# Patient Record
Sex: Female | Born: 1970 | Race: Black or African American | Hispanic: No | Marital: Married | Smoking: Never smoker
Health system: Southern US, Community
[De-identification: ages and names within clinical notes are randomized; demographics above are authoritative.]

## PROBLEM LIST (undated history)

## (undated) DIAGNOSIS — I10 Essential (primary) hypertension: Secondary | ICD-10-CM

## (undated) DIAGNOSIS — K900 Celiac disease: Secondary | ICD-10-CM

## (undated) DIAGNOSIS — R011 Cardiac murmur, unspecified: Secondary | ICD-10-CM

## (undated) DIAGNOSIS — G459 Transient cerebral ischemic attack, unspecified: Secondary | ICD-10-CM

## (undated) HISTORY — DX: Transient cerebral ischemic attack, unspecified: G45.9

## (undated) HISTORY — DX: Celiac disease: K90.0

## (undated) HISTORY — DX: Cardiac murmur, unspecified: R01.1

## (undated) HISTORY — PX: OTHER SURGICAL HISTORY: SHX169

---

## 2015-08-05 ENCOUNTER — Other Ambulatory Visit: Payer: Self-pay | Admitting: Family Medicine

## 2015-08-05 DIAGNOSIS — Z1231 Encounter for screening mammogram for malignant neoplasm of breast: Secondary | ICD-10-CM

## 2015-08-27 ENCOUNTER — Ambulatory Visit: Payer: Self-pay

## 2018-04-21 ENCOUNTER — Encounter: Payer: Self-pay | Admitting: Internal Medicine

## 2018-04-21 ENCOUNTER — Other Ambulatory Visit: Payer: Self-pay

## 2018-04-21 ENCOUNTER — Observation Stay
Admission: EM | Admit: 2018-04-21 | Discharge: 2018-04-22 | Disposition: A | Discharge status: Home / Self Care | Payer: 59 | Attending: Internal Medicine | Admitting: Internal Medicine

## 2018-04-21 ENCOUNTER — Observation Stay: Payer: 59

## 2018-04-21 ENCOUNTER — Emergency Department: Payer: 59

## 2018-04-21 DIAGNOSIS — Z8249 Family history of ischemic heart disease and other diseases of the circulatory system: Secondary | ICD-10-CM | POA: Insufficient documentation

## 2018-04-21 DIAGNOSIS — I10 Essential (primary) hypertension: Secondary | ICD-10-CM | POA: Diagnosis not present

## 2018-04-21 DIAGNOSIS — R29701 NIHSS score 1: Secondary | ICD-10-CM | POA: Diagnosis not present

## 2018-04-21 DIAGNOSIS — Z886 Allergy status to analgesic agent status: Secondary | ICD-10-CM | POA: Insufficient documentation

## 2018-04-21 DIAGNOSIS — F419 Anxiety disorder, unspecified: Secondary | ICD-10-CM | POA: Diagnosis not present

## 2018-04-21 DIAGNOSIS — Z79899 Other long term (current) drug therapy: Secondary | ICD-10-CM | POA: Diagnosis not present

## 2018-04-21 DIAGNOSIS — G459 Transient cerebral ischemic attack, unspecified: Secondary | ICD-10-CM | POA: Diagnosis present

## 2018-04-21 DIAGNOSIS — R262 Difficulty in walking, not elsewhere classified: Secondary | ICD-10-CM | POA: Insufficient documentation

## 2018-04-21 HISTORY — DX: Essential (primary) hypertension: I10

## 2018-04-21 LAB — APTT: APTT: 25 s (ref 24–36)

## 2018-04-21 LAB — DIFFERENTIAL
BASOS PCT: 1 %
Basophils Absolute: 0.1 10*3/uL (ref 0–0.1)
EOS ABS: 0.1 10*3/uL (ref 0–0.7)
Eosinophils Relative: 2 %
Lymphocytes Relative: 30 %
Lymphs Abs: 1.7 10*3/uL (ref 1.0–3.6)
MONOS PCT: 12 %
Monocytes Absolute: 0.7 10*3/uL (ref 0.2–0.9)
NEUTROS ABS: 3.1 10*3/uL (ref 1.4–6.5)
NEUTROS PCT: 55 %

## 2018-04-21 LAB — CBC
HEMATOCRIT: 34.1 % — AB (ref 35.0–47.0)
Hemoglobin: 11.1 g/dL — ABNORMAL LOW (ref 12.0–16.0)
MCH: 23.3 pg — ABNORMAL LOW (ref 26.0–34.0)
MCHC: 32.4 g/dL (ref 32.0–36.0)
MCV: 72 fL — ABNORMAL LOW (ref 80.0–100.0)
Platelets: 352 10*3/uL (ref 150–440)
RBC: 4.74 MIL/uL (ref 3.80–5.20)
RDW: 18.5 % — AB (ref 11.5–14.5)
WBC: 5.6 10*3/uL (ref 3.6–11.0)

## 2018-04-21 LAB — COMPREHENSIVE METABOLIC PANEL
ALT: 21 U/L (ref 0–44)
AST: 32 U/L (ref 15–41)
Albumin: 3.7 g/dL (ref 3.5–5.0)
Alkaline Phosphatase: 34 U/L — ABNORMAL LOW (ref 38–126)
Anion gap: 7 (ref 5–15)
BUN: 9 mg/dL (ref 6–20)
CHLORIDE: 111 mmol/L (ref 98–111)
CO2: 21 mmol/L — AB (ref 22–32)
CREATININE: 0.85 mg/dL (ref 0.44–1.00)
Calcium: 8.2 mg/dL — ABNORMAL LOW (ref 8.9–10.3)
GFR calc Af Amer: >60 mL/min (ref >60)
GFR calc non Af Amer: >60 mL/min (ref >60)
Glucose, Bld: 96 mg/dL (ref 70–99)
POTASSIUM: 4.2 mmol/L (ref 3.5–5.1)
Sodium: 139 mmol/L (ref 135–145)
Total Bilirubin: 0.7 mg/dL (ref 0.3–1.2)
Total Protein: 7 g/dL (ref 6.5–8.1)

## 2018-04-21 LAB — PROTIME-INR
INR: 0.96
Prothrombin Time: 12.7 seconds (ref 11.4–15.2)

## 2018-04-21 LAB — TROPONIN I

## 2018-04-21 LAB — GLUCOSE, CAPILLARY: Glucose-Capillary: 93 mg/dL (ref 70–99)

## 2018-04-21 MED ORDER — CLONAZEPAM 0.5 MG PO TABS
0.5000 mg | ORAL_TABLET | Freq: Two times a day (BID) | ORAL | Status: DC | PRN
  Filled 2018-04-21: qty 1

## 2018-04-21 MED ORDER — ATORVASTATIN CALCIUM 20 MG PO TABS
40.0000 mg | ORAL_TABLET | Freq: Every day | ORAL | Status: DC
  Administered 2018-04-21: 40 mg via ORAL
  Filled 2018-04-21: qty 2

## 2018-04-21 MED ORDER — ACETAMINOPHEN 650 MG RE SUPP: 650.0000 mg | RECTAL | Status: DC | PRN

## 2018-04-21 MED ORDER — LOSARTAN POTASSIUM 50 MG PO TABS
50.0000 mg | ORAL_TABLET | Freq: Every day | ORAL | Status: DC
  Administered 2018-04-22: 50 mg via ORAL
  Filled 2018-04-21: qty 1

## 2018-04-21 MED ORDER — HYDRALAZINE HCL 20 MG/ML IJ SOLN: 10.0000 mg | Freq: Four times a day (QID) | INTRAMUSCULAR | Status: DC | PRN

## 2018-04-21 MED ORDER — ACETAMINOPHEN 325 MG PO TABS
650.0000 mg | ORAL_TABLET | ORAL | Status: DC | PRN
  Administered 2018-04-21: 18:00:00 650 mg via ORAL
  Filled 2018-04-21: qty 2

## 2018-04-21 MED ORDER — STROKE: EARLY STAGES OF RECOVERY BOOK
Freq: Once | Status: AC
  Administered 2018-04-21: 14:00:00

## 2018-04-21 MED ORDER — ACETAMINOPHEN 160 MG/5ML PO SOLN
650.0000 mg | ORAL | Status: DC | PRN
  Filled 2018-04-21: qty 20.3

## 2018-04-21 MED ORDER — SODIUM CHLORIDE 0.9 % IV SOLN
INTRAVENOUS | Status: DC
  Administered 2018-04-21 – 2018-04-22 (×2): via INTRAVENOUS

## 2018-04-21 MED ORDER — CLOPIDOGREL BISULFATE 75 MG PO TABS
75.0000 mg | ORAL_TABLET | Freq: Every day | ORAL | Status: DC
  Administered 2018-04-22: 75 mg via ORAL
  Filled 2018-04-21: qty 1

## 2018-04-21 MED ORDER — CLOPIDOGREL BISULFATE 75 MG PO TABS
300.0000 mg | ORAL_TABLET | Freq: Once | ORAL | Status: AC
  Administered 2018-04-21: 300 mg via ORAL
  Filled 2018-04-21: qty 4

## 2018-04-21 MED ORDER — METOPROLOL TARTRATE 25 MG PO TABS
25.0000 mg | ORAL_TABLET | Freq: Two times a day (BID) | ORAL | Status: DC
  Administered 2018-04-21 – 2018-04-22 (×3): 25 mg via ORAL
  Filled 2018-04-21 (×3): qty 1

## 2018-04-21 MED ORDER — SENNOSIDES-DOCUSATE SODIUM 8.6-50 MG PO TABS: 1.0000 | ORAL_TABLET | Freq: Every evening | ORAL | Status: DC | PRN

## 2018-04-21 MED ORDER — ENOXAPARIN SODIUM 40 MG/0.4ML ~~LOC~~ SOLN
40.0000 mg | SUBCUTANEOUS | Status: DC
  Administered 2018-04-21: 40 mg via SUBCUTANEOUS
  Filled 2018-04-21: qty 0.4

## 2018-04-21 NOTE — ED Triage Notes (Signed)
First nurse:  Patient says at 630 am her left arm was completely numb and she could not move her hand at all.  Says her left side face was numb as well and she was having trouble speaking.  Continues to have numbness in face and arm.  Has good strength in both arms.  Can see some slight droop on left side face.  Speech is clear now.

## 2018-04-21 NOTE — Evaluation (Signed)
Physical Therapy Evaluation Patient Details Name: Maria Rhodes MRN: [id number] DOB: 04/13/1971 Today's Date: 04/21/2018   History of Present Illness  Pt is a 47 y.o. female that was addmitted to hosptial 8/16 for dx of TIA. Pts c/c are numbness, L UE weakness, and difficulty speaking. PMH includes HTN.  Clinical Impression  Pt is a pleasant 47 year old female who was admitted for TIA. Pt performs bed mobility, transfers, and ambulation with Mod I at most for safety. PT noted no difficulties with UEs/LEs in strength, coordination, sensation, 2 point discrimination, and RAMs. Pt noted no difficulties with cognition and speaking. Pt did state that her L UE still feel weak and diminished sensation, but Pt noticed no significant difference R vs L. PT does not recommend any follow up PT 2/2 pt at roughly baseline level, but educated pt on signs/symptoms of stroke and pt verbalized understanding. This entire session was guided, instructed, and directly supervised by Cephus SlaterKristen Brown , DPT.   Follow Up Recommendations No PT follow up    Equipment Recommendations  None recommended by PT    Recommendations for Other Services       Precautions / Restrictions Precautions Precautions: Fall Restrictions Weight Bearing Restrictions: No      Mobility  Bed Mobility Overal bed mobility: Modified Independent Bed Mobility: Supine to Sit     Supine to sit: HOB elevated     General bed mobility comments: NO difficulties noted  Transfers Overall transfer level: Needs assistance Equipment used: None Transfers: Sit to/from Stand Sit to Stand: Modified independent (Device/Increase time)         General transfer comment: for safety  Ambulation/Gait Ambulation/Gait assistance: Independent Gait Distance (Feet): 20 Feet Assistive device: None Gait Pattern/deviations: Step-to pattern     General Gait Details: Pt walked 20' in room and had no difficulties turning, head turns, and pt  performs squat to pick up jaket with no difficulty.  Stairs            Wheelchair Mobility    Modified Rankin (Stroke Patients Only)       Balance Overall balance assessment: Independent Sitting-balance support: No upper extremity supported;Feet unsupported Sitting balance-Leahy Scale: Normal     Standing balance support: No upper extremity supported Standing balance-Leahy Scale: Good   Single Leg Stance - Right Leg: 10 Single Leg Stance - Left Leg: 10     Rhomberg - Eyes Opened: 30                   Pertinent Vitals/Pain Pain Assessment: No/denies pain    Home Living Family/patient expects to be discharged to:: Private residence Living Arrangements: Alone Available Help at Discharge: Family;Friend(s)(prefers no help) Type of Home: Mobile home Home Access: Level entry     Home Layout: One level Home Equipment: None      Prior Function Level of Independence: Independent               Hand Dominance   Dominant Hand: Right    Extremity/Trunk Assessment   Upper Extremity Assessment Upper Extremity Assessment: Overall WFL for tasks assessed    Lower Extremity Assessment Lower Extremity Assessment: Overall WFL for tasks assessed    Cervical / Trunk Assessment Cervical / Trunk Assessment: Normal  Communication   Communication: No difficulties  Cognition Arousal/Alertness: Awake/alert Behavior During Therapy: WFL for tasks assessed/performed Overall Cognitive Status: Within Functional Limits for tasks assessed  General Comments      Exercises     Assessment/Plan    PT Assessment Patent does not need any further PT services  PT Problem List         PT Treatment Interventions      PT Goals (Current goals can be found in the Care Plan section)  Acute Rehab PT Goals Patient Stated Goal: go home PT Goal Formulation: With patient Time For Goal Achievement:  05/05/18 Potential to Achieve Goals: Good    Frequency     Barriers to discharge        Co-evaluation               AM-PAC PT "6 Clicks" Daily Activity  Outcome Measure Difficulty turning over in bed (including adjusting bedclothes, sheets and blankets)?: None Difficulty moving from lying on back to sitting on the side of the bed? : None Difficulty sitting down on and standing up from a chair with arms (e.g., wheelchair, bedside commode, etc,.)?: None Help needed moving to and from a bed to chair (including a wheelchair)?: None Help needed walking in hospital room?: None Help needed climbing 3-5 steps with a railing? : None 6 Click Score: 24    End of Session Equipment Utilized During Treatment: Gait belt Activity Tolerance: Patient tolerated treatment well Patient left: in bed;with call bell/phone within reach;with nursing/sitter in room Nurse Communication: Mobility status PT Visit Diagnosis: Difficulty in walking, not elsewhere classified (R26.2)    Time: [id number] PT Time Calculation (min) (ACUTE ONLY): 21 min   Charges:              Maria Rhodes, SPT   Maria Rhodes 04/21/2018, 5:08 PM

## 2018-04-21 NOTE — Progress Notes (Signed)
OT Cancellation Note  Patient Details Name: Maria Rhodes MRN: [id number] DOB: 01/10/1971   Cancelled Treatment:    Reason Eval/Treat Not Completed: Patient at procedure or test/ unavailable. On 2nd attempt, pt out of the room for testing. Daughter in room, reports pt is doing very well, ambulating without difficulty and no functional deficits related to slight numbness/tingling that remains in her L hand. Will re-attempt OT evaluation at later date/time as pt is available and medically appropriate.   Richrd PrimeJamie Stiller, MPH, MS, OTR/L ascom [phone] 04/21/18, 3:35 PM

## 2018-04-21 NOTE — Progress Notes (Signed)
SLP Cancellation Note  Patient Details Name: Maria Rhodes MRN: [id number] DOB: 11/21/1970   Cancelled treatment:       Reason Eval/Treat Not Completed: SLP screened, no needs identified, will sign off(chart reviewed; consulted NSG re: pt's status). Per NSG and family report, pt is exhibiting no functional deficits except slight numbness/tingling that remains in her L hand. Pt has denied any difficulty swallowing and is currently on a regular diet; tolerates swallowing pills w/ water per NSG. Pt converses at conversational level w/out deficits noted by NSG and family; no speech-language deficits.  No further skilled ST services indicated as pt appears back at her baseline per report. NSG to reconsult if any change in status.     Jerilynn SomKatherine Miliyah Luper, MS, CCC-SLP Purvi Ruehl 04/21/2018, 3:50 PM

## 2018-04-21 NOTE — Consult Note (Signed)
[facility] TeleNeurology Consult Services   The patient was informed the neurology consult would happen via TeleHealth by way of interactive audio and visual telecommunications and consented to receiving care in this manner for acute stroke protocol. DATE: April 21, 2018 Impression: Stroke-the patient appears to be having a right hemispheric subcortical stroke syndrome-her left arm weakness has resolved and I'll she has left-sided numbness.  At this point with minor nondisabling symptoms that are improving would not recommend acute thrombolytic therapy.  If her symptoms decline in any way or she has return of loss of motor function and still fits within the TPA window would still consider the medication.  For now risk outweighs benefit particularly in light of being right-handed.  Did discuss TPA with her however.  Recommend starting Plavix as she has a true aspirin allergy and would admitted for stroke workup.  She'll need inpatient neurology consultation.  Allow permissive hypertension per not TPA stroke protocol until neurologic evaluation.   Not a tpa candidate due to: Minor nondisabling symptoms  Symptoms (not) consistent with LVO therefore no role for neuro-intervention  Differential Diagnosis: 1. Cardioembolic stroke 2. Small vessel disease/lacune 3. Thromboembolic, artery-to-artery mechanism 4. Hypercoagulable state-related infarct 5. Transient ischemic attack 6. Thrombotic mechanism, large artery disease  Comments: Last known normal 06:30 Door time 08:05 [facility] contacted: 08:24 [facility] at bedside: 08:30 NIHSS assessment time: 08:32  Recommendations: -Start Plavix 300 mg load 75 mg daily -Admitted for stroke workup and inpatient neurology consultation -Permissive hypertension per non-TPA stroke protocol  Inpatient neurology consultation Inpatient stroke evaluation as per Neurology/ Internal Medicine Discussed with ED MD Please call with  questions ----------------------------------------------------------------------------------------- CC left-sided weakness numbness  History of Present Illness  Patient is a 47 year old woman with a history of hypertension and anxiety who woke up this morning at 6 AM without any issues.  She then developed at 6:30 in the morning left-sided numbness to her face and arm in addition to some left hand and arm weakness she still has been numbness but her weakness has completely resolved.  Her blood pressure is moderately elevated in the 140 systolic range.  She has an aspirin allergy takes no blood thinners.  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  Exam: 1a- LOC: Keenly responsive - =0     1b- LOC questions: Answers both questions correctly - 0     1c- LOC commands- Performs both tasks correctly- 0     2- Gaze: Normal; no gaze paresis or gaze deviation - 0     3- Visual Fields: normal, no Visual field deficit - 0     4- Facial movements: no facial palsy - 0     5- Upper limb motor - no drift -0    no drift and manual muscle strength testing of the grips biceps and triceps are symmetric 6- Lower limb motor - no drift - 0      7- Limb Coordination: absent ataxia - 0      8- Sensory : Decreased left face and arm=1    9- Language - No aphasia - 0      10- Speech - No dysarthria -0     11- Neglect / Extinction - none found -0     NIHSS score  1   Medical Decision Making: - Extensive number of diagnosis or management options are considered above. - Extensive amount of complex data reviewed. - High risk of complication and/or morbidity or mortality are associated with differential  diagnostic considerations above. - There may be Uncertain outcome and increased probability of prolonged functional impairment or high probability of severe prolonged functional impairment associated with some of these differential diagnosis. Medical Data Reviewed: 1.Data reviewed  include clinical labs, radiology, Medical Tests; 2.Tests results discussed w/performing or interpreting physician; 3.Obtaining/reviewing old medical records; 4.Obtaining case history from another source; 5.Independent review of image, tracing or specimen. Patient was informed the Neurology Consult would happen via telehealth (remote video) and consented to receiving care in this manner.

## 2018-04-21 NOTE — Progress Notes (Signed)
Chaplain was informed of code stroke by another chaplain. Chaplain went to patient's room and introduced herself to patient and family member. Chaplain ask patient how was she feeling, patient said she was ready to go. Patient seemed to be shock as why the Chaplain was here to visit her. Chaplain explain to patient she was a part of the care team and it was normal procedure. Chaplain ask was there anything she could do for her and patient said no thank you. Chaplain said if she needs her have nurse page.

## 2018-04-21 NOTE — H&P (Signed)
[facility] - [place] [facility]   PATIENT NAME: Maria Rhodes    MR#:  [id number]  DATE OF BIRTH:  12/17/1970  DATE OF ADMISSION:  04/21/2018  PRIMARY CARE PHYSICIAN: Maria LouShapely-Quinn, Todd [place], MD   REQUESTING/REFERRING PHYSICIAN: Dionne BucySiadecki, Sebastian, MD  CHIEF COMPLAINT:   Chief Complaint  Patient presents with  . Numbness  . Code Stroke   Left arm weakness, numbness and tingling this morning. HISTORY OF PRESENT ILLNESS:  Maria Rhodes  is a 47 y.o. female with a known history of hypertension.  The patient presents the ED with above chief complaints.  She started to have left arm weakness, numbness and tingling at about 6:30 this morning, now almost resolved but still has some numbness.  She also has some numbness on the left side of the face and difficulty speaking, which resolved.  The patient denies any dysphagia, slurred speech or incontinence.  She denies any headache or dizziness.  CT of the head is negative. PAST MEDICAL HISTORY:   Past Medical History:  Diagnosis Date  . HTN (hypertension)     PAST SURGICAL HISTORY:  No surgical history.  SOCIAL HISTORY:   Social History   Tobacco Use  . Smoking status: Not on file  Substance Use Topics  . Alcohol use: Not on file    FAMILY HISTORY:   Family History  Problem Relation Age of Onset  . Hypertension Mother   . Hypertension Father     DRUG ALLERGIES:   Allergies  Allergen Reactions  . Asa [Aspirin] Nausea Only and Rash    REVIEW OF SYSTEMS:   Review of Systems  Constitutional: Negative for chills, fever and malaise/fatigue.  HENT: Negative for sore throat.   Eyes: Negative for blurred vision and double vision.  Respiratory: Negative for cough, hemoptysis, shortness of breath, wheezing and stridor.   Cardiovascular: Negative for chest pain, palpitations, orthopnea and leg swelling.  Gastrointestinal: Negative for abdominal pain, blood in stool, diarrhea,  melena, nausea and vomiting.  Genitourinary: Negative for dysuria, flank pain and hematuria.  Musculoskeletal: Negative for back pain and joint pain.  Skin: Negative for rash.  Neurological: Positive for tingling, sensory change, speech change and focal weakness. Negative for dizziness, tremors, seizures, loss of consciousness, weakness and headaches.  Endo/Heme/Allergies: Negative for polydipsia.  Psychiatric/Behavioral: Negative for depression. The patient is not nervous/anxious.     MEDICATIONS AT HOME:   Prior to Admission medications   Medication Sig Start Date End Date Taking? Authorizing Provider  ALAYCEN 1/35 tablet Take 1 tablet by mouth daily. 04/17/18  Yes [provider]  clonazePAM (KLONOPIN) 0.5 MG tablet Take 0.5 mg by mouth 2 (two) times daily as needed for anxiety. 02/06/18  Yes [provider]  losartan (COZAAR) 100 MG tablet Take 50 mg by mouth daily. 04/01/18  Yes [provider]      VITAL SIGNS:  Blood pressure (!) 179/104, pulse 78, resp. rate (!) 27, weight 67 kg, SpO2 100 %.  PHYSICAL EXAMINATION:  Physical Exam  GENERAL:  [age] y.o.-year-old patient lying in the bed with no acute distress.  EYES: Pupils equal, round, reactive to light and accommodation. No scleral icterus. Extraocular muscles intact.  HEENT: Head atraumatic, normocephalic. Oropharynx and nasopharynx clear.  NECK:  Supple, no jugular venous distention. No thyroid enlargement, no tenderness.  LUNGS: Normal breath sounds bilaterally, no wheezing, rales,rhonchi or crepitation. No use of accessory muscles of respiration.  CARDIOVASCULAR: S1, S2 normal. No murmurs, rubs, or gallops.  ABDOMEN: Soft, nontender, nondistended. Bowel sounds present. No organomegaly or mass.  EXTREMITIES: No pedal edema, cyanosis, or clubbing.  NEUROLOGIC: Cranial nerves II through XII are intact. Muscle strength 5/5 in all extremities. Sensation intact. Gait not checked.  PSYCHIATRIC: The patient  is alert and oriented x 3.  SKIN: No obvious rash, lesion, or ulcer.   LABORATORY PANEL:   CBC Recent Labs  Lab 04/21/18 0827  WBC 5.6  HGB 11.1*  HCT 34.1*  PLT 352   ------------------------------------------------------------------------------------------------------------------  Chemistries  Recent Labs  Lab 04/21/18 0827  NA 139  K 4.2  CL 111  CO2 21*  GLUCOSE 96  BUN 9  CREATININE 0.85  CALCIUM 8.2*  AST 32  ALT 21  ALKPHOS 34*  BILITOT 0.7   ------------------------------------------------------------------------------------------------------------------  Cardiac Enzymes Recent Labs  Lab 04/21/18 0827  TROPONINI <0.03   ------------------------------------------------------------------------------------------------------------------  RADIOLOGY:  Ct Head Code Stroke Wo Contrast  Result Date: 04/21/2018 CLINICAL DATA:  Code stroke.  Lost feeling in left arm. EXAM: CT HEAD WITHOUT CONTRAST TECHNIQUE: Contiguous axial images were obtained from the base of the skull through the vertex without intravenous contrast. COMPARISON:  None. FINDINGS: Brain: Small remote right inferior cerebellar infarct. No evidence of acute infarct. No hemorrhage, hydrocephalus, or masslike finding Vascular: No hyperdense vessel Skull: Negative Sinuses/Orbits: Negative Other: These results were called by telephone at the time of interpretation on 04/21/2018 at 8:30 am to Dr. Dionne BucySEBASTIAN SIADECKI , who verbally acknowledged these results. ASPECTS [facility]([place] Stroke Program Early CT Score) - Ganglionic level infarction (caudate, lentiform nuclei, internal capsule, insula, M1-M3 cortex): 7 - Supraganglionic infarction (M4-M6 cortex): 3 Total score (0-10 with 10 being normal): 10 IMPRESSION: 1. No acute finding.  ASPECTS is 10. 2. Small remote right cerebellar infarct. Electronically Signed   By: Marnee SpringJonathon  Watts M.D.   On: 04/21/2018 08:31      IMPRESSION AND PLAN:   TIA, rule out acute  CVA. Patient will be placed for observation. She is treated with Plavix 300 mg in the ED, continue Plavix 75 mg daily, Lipitor 40 mg at bedtime, check lipid panel.  Follow-up MRI/MRA of brain, echocardiograph and carotid duplex. PT OT evaluation.  Neuro check and neurology consult.  Hypertension, accelerated, continue losartan, 8 Lopressor.  IV hydralazine PRN. Anxiety.  Home medication PRN.   All the records are reviewed and case discussed with ED provider. Management plans discussed with the patient, family and they are in agreement.  CODE STATUS: Full code.  TOTAL TIME TAKING CARE OF THIS PATIENT: 40 minutes.    Shaune PollackQing Kaleiah Kutzer M.D on 04/21/2018 at 11:00 AM  Between 7am to 6pm - Pager - [phone]  After 6pm go to [email or web address] - Scientist, research (life sciences)password [email or web address]  [facility] [place] [facility]  Office  [phone]  CC: Primary care physician; Shapely-Quinn, Desiree Lucyodd [place], MD   Note: This dictation was prepared with Dragon dictation along with smaller phrase technology. Any transcriptional errors that result from this process are unin

## 2018-04-21 NOTE — Consult Note (Signed)
Referring Physician: Imogene Burn    Chief Complaint: Left sided numbness  HPI: Maria Rhodes is an 47 y.o. female who reports developing left sided numbness and weakness t his morning.  Numbness also noted on left sided of face with some associated  Change in speech.  Symptoms had improved on presentation.  tPA not administered.   Initial NIHSS of 1  Date last known well: Date: 04/21/2018 Time last known well: Time: 06:30 tPA Given: No: Improving minimal symptoms  Past Medical History:  Diagnosis Date  . HTN (hypertension)     History reviewed. No pertinent surgical history.  Family History  Problem Relation Age of Onset  . Hypertension Mother   . Hypertension Father    Social History:  reports that she has never smoked. She has never used smokeless tobacco. Her alcohol and drug histories are not on file.  Allergies:  Allergies  Allergen Reactions  . Asa [Aspirin] Nausea Only and Rash    Medications:  I have reviewed the patient's current medications. Prior to Admission:  Medications Prior to Admission  Medication Sig Dispense Refill Last Dose  . ALAYCEN 1/35 tablet Take 1 tablet by mouth daily.  6 UNKNOWN at UNKNOWN  . clonazePAM (KLONOPIN) 0.5 MG tablet Take 0.5 mg by mouth 2 (two) times daily as needed for anxiety.  0 PRN at PRN  . losartan (COZAAR) 100 MG tablet Take 50 mg by mouth daily.  5 04/21/2018 at 0700   Scheduled: . atorvastatin  40 mg Oral q1800  . [START ON 04/22/2018] clopidogrel  75 mg Oral Daily  . enoxaparin (LOVENOX) injection  40 mg Subcutaneous Q24H  . losartan  50 mg Oral Daily  . metoprolol tartrate  25 mg Oral BID    ROS: History obtained from the patient  General ROS: negative for - chills, fatigue, fever, night sweats, weight gain or weight loss Psychological ROS: negative for - behavioral disorder, hallucinations, memory difficulties, mood swings or suicidal ideation Ophthalmic ROS: negative for - blurry vision, double vision, eye  pain or loss of vision ENT ROS: negative for - epistaxis, nasal discharge, oral lesions, sore throat, tinnitus or vertigo Allergy and Immunology ROS: negative for - hives or itchy/watery eyes Hematological and Lymphatic ROS: negative for - bleeding problems, bruising or swollen lymph nodes Endocrine ROS: negative for - galactorrhea, hair pattern changes, polydipsia/polyuria or temperature intolerance Respiratory ROS: negative for - cough, hemoptysis, shortness of breath or wheezing Cardiovascular ROS: negative for - chest pain, dyspnea on exertion, edema or irregular heartbeat Gastrointestinal ROS: negative for - abdominal pain, diarrhea, hematemesis, nausea/vomiting or stool incontinence Genito-Urinary ROS: negative for - dysuria, hematuria, incontinence or urinary frequency/urgency Musculoskeletal ROS: negative for - joint swelling or muscular weakness Neurological ROS: as noted in HPI Dermatological ROS: negative for rash and skin lesion changes  Physical Examination: Blood pressure 104/85, pulse 65, temperature 99.6 F (37.6 C), temperature source Oral, resp. rate 18, height 5\' 2"  (1.575 m), weight 68 kg, SpO2 100 %.  HEENT-  Normocephalic, no lesions, without obvious abnormality.  Normal external eye and conjunctiva.  Normal TM's bilaterally.  Normal auditory canals and external ears. Normal external nose, mucus membranes and septum.  Normal pharynx. Cardiovascular- S1, S2 normal, pulses palpable throughout   Lungs- chest clear, no wheezing, rales, normal symmetric air entry Abdomen- soft, non-tender; bowel sounds normal; no masses,  no organomegaly Extremities- no edema Lymph-no adenopathy palpable Musculoskeletal-no joint tenderness, deformity or swelling Skin-warm and dry, no hyperpigmentation, vitiligo, or suspicious lesions  Neurological Examination   Mental Status: Alert, oriented, thought content appropriate.  Speech fluent without evidence of aphasia.  Able to follow 3 step  commands without difficulty. Cranial Nerves: II: Discs flat bilaterally; Visual fields grossly normal, pupils equal, round, reactive to light and accommodation III,IV, VI: ptosis not present, extra-ocular motions intact bilaterally V,VII: smile symmetric, facial light touch sensation decreased on the left side of the face VIII: hearing normal bilaterally IX,X: gag reflex present XI: bilateral shoulder shrug XII: midline tongue extension Motor: Right : Upper extremity   5/5    Left:     Upper extremity   5/5  Lower extremity   5/5     Lower extremity   5/5 Tone and bulk:normal tone throughout; no atrophy noted Sensory: Pinprick and light touch decreased on the LUE Deep Tendon Reflexes: 2+ and symmetric throughout Plantars: Right: downgoing   Left: downgoing Cerebellar: normal finger-to-nose, normal rapid alternating movements and normal heel-to-shin testing bilaterally Gait: normal gait and station    Laboratory Studies:  Basic Metabolic Panel: Recent Labs  Lab 04/21/18 0827  NA 139  K 4.2  CL 111  CO2 21*  GLUCOSE 96  BUN 9  CREATININE 0.85  CALCIUM 8.2*    Liver Function Tests: Recent Labs  Lab 04/21/18 0827  AST 32  ALT 21  ALKPHOS 34*  BILITOT 0.7  PROT 7.0  ALBUMIN 3.7   No results for input(s): LIPASE, AMYLASE in the last 168 hours. No results for input(s): AMMONIA in the last 168 hours.  CBC: Recent Labs  Lab 04/21/18 0827  WBC 5.6  NEUTROABS 3.1  HGB 11.1*  HCT 34.1*  MCV 72.0*  PLT 352    Cardiac Enzymes: Recent Labs  Lab 04/21/18 0827  TROPONINI <0.03    BNP: Invalid input(s): POCBNP  CBG: Recent Labs  Lab 04/21/18 0814  GLUCAP 93    Microbiology: No results found for this or any previous visit.  Coagulation Studies: Recent Labs    04/21/18 1233  LABPROT 12.7  INR 0.96    Urinalysis: No results for input(s): COLORURINE, LABSPEC, PHURINE, GLUCOSEU, HGBUR, BILIRUBINUR, KETONESUR, PROTEINUR, UROBILINOGEN, NITRITE,  LEUKOCYTESUR in the last 168 hours.  Invalid input(s): APPERANCEUR  Lipid Panel: No results found for: CHOL, TRIG, HDL, CHOLHDL, VLDL, LDLCALC  HgbA1C: No results found for: HGBA1C  Urine Drug Screen:  No results found for: LABOPIA, COCAINSCRNUR, LABBENZ, AMPHETMU, THCU, LABBARB  Alcohol Level: No results for input(s): ETH in the last 168 hours.  Other results: EKG: sinus rhythm at 78 bpm.  Imaging: [place] Carotid Bilateral (at [facility] Only)  Result Date: 04/21/2018 CLINICAL DATA:  TIA symptoms EXAM: BILATERAL CAROTID DUPLEX ULTRASOUND TECHNIQUE: Wallace CullensGray scale imaging, color Doppler and duplex ultrasound were performed of bilateral carotid and vertebral arteries in the neck. COMPARISON:  None. FINDINGS: Criteria: Quantification of carotid stenosis is based on velocity parameters that correlate the residual internal carotid diameter with NASCET-based stenosis levels, using the diameter of the distal internal carotid lumen as the denominator for stenosis measurement. The following velocity measurements were obtained: RIGHT ICA: 106/44 cm/sec CCA: 78/21 cm/sec SYSTOLIC ICA/CCA RATIO:  1.4 ECA: 82 cm/sec LEFT ICA: 125/49 cm/sec CCA: 84/21 cm/sec SYSTOLIC ICA/CCA RATIO:  1.5 ECA: 79 cm/sec RIGHT CAROTID ARTERY: Minor carotid intimal thickening without significant atherosclerosis. No hemodynamically significant right ICA stenosis, velocity elevation, or turbulent flow. Degree of narrowing less than 50%. RIGHT VERTEBRAL ARTERY:  Antegrade LEFT CAROTID ARTERY: Similar carotid intimal thickening without significant atherosclerosis. No hemodynamically significant  left ICA stenosis, velocity elevation, or turbulent flow. LEFT VERTEBRAL ARTERY:  Antegrade IMPRESSION: Carotid intimal thickening without significant atherosclerosis. No hemodynamically significant ICA stenosis. Degree of narrowing less than 50% bilaterally by ultrasound criteria. Patent antegrade vertebral flow bilaterally Electronically Signed   By:  Judie PetitM.  Shick M.D.   On: 04/21/2018 16:32   Ct Head Code Stroke Wo Contrast  Result Date: 04/21/2018 CLINICAL DATA:  Code stroke.  Lost feeling in left arm. EXAM: CT HEAD WITHOUT CONTRAST TECHNIQUE: Contiguous axial images were obtained from the base of the skull through the vertex without intravenous contrast. COMPARISON:  None. FINDINGS: Brain: Small remote right inferior cerebellar infarct. No evidence of acute infarct. No hemorrhage, hydrocephalus, or masslike finding Vascular: No hyperdense vessel Skull: Negative Sinuses/Orbits: Negative Other: These results were called by telephone at the time of interpretation on 04/21/2018 at 8:30 am to Dr. Dionne BucySEBASTIAN SIADECKI , who verbally acknowledged these results. ASPECTS [facility]([place] Stroke Program Early CT Score) - Ganglionic level infarction (caudate, lentiform nuclei, internal capsule, insula, M1-M3 cortex): 7 - Supraganglionic infarction (M4-M6 cortex): 3 Total score (0-10 with 10 being normal): 10 IMPRESSION: 1. No acute finding.  ASPECTS is 10. 2. Small remote right cerebellar infarct. Electronically Signed   By: Marnee SpringJonathon  Watts M.D.   On: 04/21/2018 08:31    Assessment: 47 y.o. female presenting with complaints of left sided numbness that have improved but not resolved completely.  Head CT reviewed and shows no acute changes but small remote cerebellar infarct noted.  Carotid dopplers show no evidence of hemodynamically significant stenosis.  Echocardiogram pending.  A1c pending, LDL pending.  Stroke Risk Factors - hypertension  Plan: 1. After 24 hours more aggressive BP management 2. MRI, MRA  of the brain without contrast pending 3. PT consult, OT consult, Speech consult 4. Echocardiogram pending.  If unremarkable would consider TEE 5. Prophylactic therapy-Antiplatelet med: Aspirin - dose 325mg  daily 6. NPO until RN stroke swallow screen 7. Telemetry monitoring 8. Frequent neuro checks   Thana FarrLeslie Rainey Rodger, MD Neurology [phone] 04/21/2018, 7:05  PM

## 2018-04-21 NOTE — ED Provider Notes (Signed)
[facility] Emergency Department Provider Note ____________________________________________   First MD Initiated Contact with Patient 04/21/18 [phone]     (approximate)  I have reviewed the triage vital signs and the nursing notes.   HISTORY  Chief Complaint Numbness and Code Stroke    HPI Maria Rhodes is a 47 y.o. female with PMH of hypertension and anxiety who presents with left arm numbness and weakness, acute onset at approximately 630 this morning, and now mostly resolved.  The patient states that she also developed numbness in the left side of her face, and difficulty speaking.  The speech difficulty has resolved, and the numbness in the face is mostly resolved.  She denies any symptoms in the leg.  No prior history of similar episodes.  No past medical history on file.  There are no active problems to display for this patient.    Prior to Admission medications   Medication Sig Start Date End Date Taking? Authorizing Provider  ALAYCEN 1/35 tablet Take 1 tablet by mouth daily. 04/17/18  Yes [provider]  clonazePAM (KLONOPIN) 0.5 MG tablet Take 0.5 mg by mouth 2 (two) times daily as needed for anxiety. 02/06/18  Yes [provider]  losartan (COZAAR) 100 MG tablet Take 50 mg by mouth daily. 04/01/18  Yes [provider]    Allergies Asa [aspirin]  No family history on file.  Social History Social History   Tobacco Use  . Smoking status: Not on file  Substance Use Topics  . Alcohol use: Not on file  . Drug use: Not on file    Review of Systems  Constitutional: No fever. Eyes: No visual changes. ENT: No neck pain. Cardiovascular: Denies chest pain. Respiratory: Denies shortness of breath. Gastrointestinal: No vomiting.  Genitourinary: Negative for flank pain.  Musculoskeletal: Negative for back pain. Skin: Negative for rash. Neurological: Negative for headache.  Positive for left arm and face  numbness and weakness.   ____________________________________________   PHYSICAL EXAM:  VITAL SIGNS: ED Triage Vitals  Enc Vitals Group     BP 04/21/18 0830 (!) 149/95     Pulse Rate 04/21/18 0830 78     Resp 04/21/18 0830 20     Temp --      Temp src --      SpO2 04/21/18 0828 98 %     Weight 04/21/18 0828 147 lb 11.3 oz (67 kg)     Height --      Head Circumference --      Peak Flow --      Pain Score --      Pain Loc --      Pain Edu? --      Excl. in GC? --     Constitutional: Alert and oriented. Well appearing and in no acute distress. Eyes: Conjunctivae are normal.  EOMI.  PERRLA. Head: Atraumatic. Nose: No congestion/rhinnorhea. Mouth/Throat: Mucous membranes are moist.   Neck: Normal range of motion.  Cardiovascular:  Good peripheral circulation. Respiratory: Normal respiratory effort. Gastrointestinal: No distention.  Musculoskeletal:  Extremities warm and well perfused.  Neurologic:  Normal speech and language.  Possible minimal left facial droop.  Decreased sensation left upper extremity.  Normal coordination.  5/5 strength in bilateral upper and lower extremities. Skin:  Skin is warm and dry. No rash noted. Psychiatric: Mood and affect are normal. Speech and behavior are normal.  ____________________________________________   LABS (all labs ordered are listed, but only abnormal results are displayed)  Labs Reviewed  CBC - Abnormal; Notable for the following components:      Result Value   Hemoglobin 11.1 (*)    HCT 34.1 (*)    MCV 72.0 (*)    MCH 23.3 (*)    RDW 18.5 (*)    All other components within normal limits  COMPREHENSIVE METABOLIC PANEL - Abnormal; Notable for the following components:   CO2 21 (*)    Calcium 8.2 (*)    Alkaline Phosphatase 34 (*)    All other components within normal limits  DIFFERENTIAL  TROPONIN I  GLUCOSE, CAPILLARY  PROTIME-INR  APTT  CBG MONITORING, ED    ____________________________________________  EKG  ED ECG REPORT I, Dionne BucySebastian Midori Dado, the attending physician, personally viewed and interpreted this ECG.  Date: 04/21/2018 EKG Time: 0830 Rate: 78 Rhythm: normal sinus rhythm QRS Axis: normal Intervals: normal ST/T Wave abnormalities: normal Narrative Interpretation: no evidence of acute ischemia  ____________________________________________  RADIOLOGY  CT head: No acute findings  ____________________________________________   PROCEDURES  Procedure(s) performed: No  Procedures  Critical Care performed: Yes  CRITICAL CARE Performed by: Dionne BucySebastian Jearline Hirschhorn   Total critical care time: 20 minutes  Critical care time was exclusive of separately billable procedures and treating other patients.  Critical care was necessary to treat or prevent imminent or life-threatening deterioration.  Critical care was time spent personally by me on the following activities: development of treatment plan with patient and/or surrogate as well as nursing, discussions with consultants, evaluation of patient's response to treatment, examination of patient, obtaining history from patient or surrogate, ordering and performing treatments and interventions, ordering and review of laboratory studies, ordering and review of radiographic studies, pulse oximetry and re-evaluation of patient's condition. ____________________________________________   INITIAL IMPRESSION / ASSESSMENT AND PLAN / ED COURSE  Pertinent labs & imaging results that were available during my care of the patient were reviewed by me and considered in my medical decision making (see chart for details).  47 year old female presents with left arm numbness and weakness, left facial numbness, and difficulty speaking at approximately 630 this morning.  The patient reports that the symptoms have now mostly resolved although she feels some residual numbness in the left arm.  No  prior history of similar episodes.  On exam currently the patient has 5/5 motor strength in all extremities, and subjective decreased sensation to left upper extremity.  There is possible minimal left facial droop.  The patient was made a code stroke and emergent CT was negative for acute findings.  She was evaluated by the stroke tele-neurologist.  NIHSS currently is 1.  No tPA is recommended at this time.  Tele-neurology recommends Plavix and admission for stroke work-up.  Clinical Course as of Apr 21 1025  Fri Apr 21, 2018  [id number] [facility] [[facility]]    Clinical Course User Index [SS] Dionne BucySiadecki, Marti Mclane, MD   ----------------------------------------- 10:26 AM on 04/21/2018 -----------------------------------------  Lab work-up is unremarkable.  Patient states that symptoms have slightly improved further.  She is stable for admission.  I signed the patient out to the hospitalist Dr. Imogene Burnhen.  ____________________________________________   FINAL CLINICAL IMPRESSION(S) / ED DIAGNOSES  Final diagnoses:  TIA (transient ischemic attack)      NEW MEDICATIONS STARTED DURING THIS VISIT:  New Prescriptions   No medications on file     Note:  This document was prepared using Dragon voice recognition software and may include unintentional dictation errors.    Dionne BucySiadecki, Hillari Zumwalt, MD 04/21/18 1026

## 2018-04-21 NOTE — Progress Notes (Signed)
OT Cancellation Note  Patient Details Name: Cristie HemLeonard G Torain Sellars MRN: [id number] DOB: 11/03/1970   Cancelled Treatment:    Reason Eval/Treat Not Completed: Other (comment). Order received, chart reviewed. PT entering pt's room for assessment. Will re-attempt OT Evaluation at later date/time as pt is available and medically appropriate.   Richrd PrimeJamie Stiller, MPH, MS, OTR/L ascom [phone] 04/21/18, 2:21 PM

## 2018-04-21 NOTE — Progress Notes (Signed)
CODE STROKE- PHARMACY COMMUNICATION   Time CODE STROKE called/page received:0815  Time response to CODE STROKE was made (in person or via phone): 0840   Time Stroke Kit retrieved from [place] (only if needed): N/A  Name of Provider/Nurse contacted:Olivia Broomer   Past Medical History:  Diagnosis Date  . HTN (hypertension)    Prior to Admission medications   Medication Sig Start Date End Date Taking? Authorizing Provider  ALAYCEN 1/35 tablet Take 1 tablet by mouth daily. 04/17/18  Yes [provider]  clonazePAM (KLONOPIN) 0.5 MG tablet Take 0.5 mg by mouth 2 (two) times daily as needed for anxiety. 02/06/18  Yes [provider]  losartan (COZAAR) 100 MG tablet Take 50 mg by mouth daily. 04/01/18  Yes [provider]    Charlett Nose ,PharmD Clinical Pharmacist  04/21/2018  2:55 PM

## 2018-04-21 NOTE — Code Documentation (Signed)
Pt arrives via POV with sudden onset of left arm numbness and weakness and left facial numbness at 0630, code stroke activated in triage, pt cleared for CT by EDP at 0815, after CT pt taken to room 24, NIHSS 1 with sesory deficit on left arm and face, pt states symptoms are currently improving but no resolved, no tPA given to due improving nondisabling symptoms. husband at bedside, Report off the [employer] RN

## 2018-04-21 NOTE — ED Notes (Signed)
Pt ambulates to toilet, steady gait, standby assist

## 2018-04-22 ENCOUNTER — Observation Stay: Admit: 2018-04-22 | Payer: 59

## 2018-04-22 LAB — LIPID PANEL
CHOL/HDL RATIO: 3.7 ratio
Cholesterol: 108 mg/dL (ref 0–200)
HDL: 29 mg/dL — AB (ref >40)
LDL CALC: 67 mg/dL (ref 0–99)
Triglycerides: 60 mg/dL (ref <150)
VLDL: 12 mg/dL (ref 0–40)

## 2018-04-22 LAB — HEMOGLOBIN A1C
Hgb A1c MFr Bld: 5.7 % — ABNORMAL HIGH (ref 4.8–5.6)
Mean Plasma Glucose: 116.89 mg/dL

## 2018-04-22 MED ORDER — ATORVASTATIN CALCIUM 40 MG PO TABS: 40.0000 mg | ORAL_TABLET | Freq: Every day | ORAL | 1 refills | Status: DC

## 2018-04-22 MED ORDER — CLOPIDOGREL BISULFATE 75 MG PO TABS: 75.0000 mg | ORAL_TABLET | Freq: Every day | ORAL | 1 refills | Status: AC

## 2018-04-22 NOTE — Progress Notes (Signed)
MD order received to discharge pt home today; verbally reviewed AVS with pt, gave Rxs to pt; no questions voiced at this time; pt discharged via wheelchair by auxillary to the visitor's entrance

## 2018-04-22 NOTE — Progress Notes (Signed)
Spoke with Dr Daisy BlossomAhearn via telephone call; Dr Daisy BlossomAhearn advised that she reviewed the MRI results and it is okay for the pt to be discharged home today; advised Dr Imogene Burnhen who was on the floor of this conversation; no new orders received

## 2018-04-22 NOTE — Discharge Summary (Signed)
[facility] [place] at [facility]   PATIENT NAME: Maria Rhodes    MR#:  [id number]  DATE OF BIRTH:  07-02-1971  DATE OF ADMISSION:  04/21/2018   ADMITTING PHYSICIAN: Shaune Pollack, MD  DATE OF DISCHARGE: 04/22/2018 10:59 AM  PRIMARY CARE PHYSICIAN: Nonda Lou, MD   ADMISSION DIAGNOSIS:  TIA (transient ischemic attack) [G45.9] DISCHARGE DIAGNOSIS:  Active Problems:   TIA (transient ischemic attack)  SECONDARY DIAGNOSIS:   Past Medical History:  Diagnosis Date  . HTN (hypertension)    HOSPITAL COURSE:   TIA, ruled out acute CVA. She was treated with Plavix 300 mg in the ED, continue Plavix 75 mg daily, Lipitor 40 mg at bedtime, check lipid panel is unremarkable.  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 Follow up Dr. Mariah Milling for cardiac workup.  Hypertension, controlled, continue losartan. Anxiety.  Home medication PRN.  DISCHARGE CONDITIONS:  Stable, discharge to home today. CONSULTS OBTAINED:  Treatment Team:  Thana Farr, MD DRUG ALLERGIES:   Allergies  Allergen Reactions  . Asa [Aspirin] Nausea Only and Rash   DISCHARGE MEDICATIONS:   Allergies as of 04/22/2018      Reactions   Asa [aspirin] Nausea Only, Rash      Medication List    TAKE these medications   ALAYCEN 1/35 tablet Generic drug:  norethindrone-ethinyl estradiol 1/35 Take 1 tablet by mouth daily.   atorvastatin 40 MG tablet Commonly known as:  LIPITOR Take 1 tablet (40 mg total) by mouth daily at 6 PM.   clonazePAM 0.5 MG tablet Commonly known as:  KLONOPIN Take 0.5 mg by mouth 2 (two) times daily as needed for anxiety.   clopidogrel 75 MG tablet Commonly known as:  PLAVIX Take 1 tablet (75 mg total) by mouth daily.   losartan 100 MG tablet Commonly known as:  COZAAR Take 50 mg by mouth daily.        DISCHARGE INSTRUCTIONS:  See AVS.  If you experience worsening of your  admission symptoms, develop shortness of breath, life threatening emergency, suicidal or homicidal thoughts you must seek medical attention immediately by calling 911 or calling your MD immediately  if symptoms less severe.  You Must read complete instructions/literature along with all the possible adverse reactions/side effects for all the Medicines you take and that have been prescribed to you. Take any new Medicines after you have completely understood and accpet all the possible adverse reactions/side effects.   Please note  You were cared for by a hospitalist during your hospital stay. If you have any questions about your discharge medications or the care you received while you were in the hospital after you are discharged, you can call the unit and asked to speak with the hospitalist on call if the hospitalist that took care of you is not available. Once you are discharged, your primary care physician will handle any further medical issues. Please note that NO REFILLS for any discharge medications will be authorized once you are discharged, as it is imperative that you return to your primary care physician (or establish a relationship with a primary care physician if you do not have one) for your aftercare needs so that they can reassess your need for medications and monitor your lab values.    On the day of Discharge:  VITAL SIGNS:  Blood pressure 118/78, pulse (!) 57, temperature 98.7 F (37.1 C), temperature source Oral, resp. rate 20, height 5\' 2"  (  1.575 m), weight 68 kg, SpO2 99 %. PHYSICAL EXAMINATION:  GENERAL:  47 y.o.-year-old patient lying in the bed with no acute distress.  EYES: Pupils equal, round, reactive to light and accommodation. No scleral icterus. Extraocular muscles intact.  HEENT: Head atraumatic, normocephalic. Oropharynx and nasopharynx clear.  NECK:  Supple, no jugular venous distention. No thyroid enlargement, no tenderness.  LUNGS: Normal breath sounds bilaterally,  no wheezing, rales,rhonchi or crepitation. No use of accessory muscles of respiration.  CARDIOVASCULAR: S1, S2 normal. No murmurs, rubs, or gallops.  ABDOMEN: Soft, non-tender, non-distended. Bowel sounds present. No organomegaly or mass.  EXTREMITIES: No pedal edema, cyanosis, or clubbing.  NEUROLOGIC: Cranial nerves II through XII are intact. Muscle strength 5/5 in all extremities. Sensation intact. Gait not checked.  PSYCHIATRIC: The patient is alert and oriented x 3.  SKIN: No obvious rash, lesion, or ulcer.  DATA REVIEW:   CBC Recent Labs  Lab 04/21/18 0827  WBC 5.6  HGB 11.1*  HCT 34.1*  PLT 352    Chemistries  Recent Labs  Lab 04/21/18 0827  NA 139  K 4.2  CL 111  CO2 21*  GLUCOSE 96  BUN 9  CREATININE 0.85  CALCIUM 8.2*  AST 32  ALT 21  ALKPHOS 34*  BILITOT 0.7     Microbiology Results  No results found for this or any previous visit.  RADIOLOGY:  Mr Brain Wo Contrast  Result Date: 04/21/2018 CLINICAL DATA:  Initial evaluation for left-sided numbness in weakness, TIA. EXAM: MRI HEAD WITHOUT CONTRAST MRA HEAD WITHOUT CONTRAST TECHNIQUE: Multiplanar, multiecho pulse sequences of the brain and surrounding structures were obtained without intravenous contrast. Angiographic images of the head were obtained using MRA technique without contrast. COMPARISON:  Prior CT from 04/21/2018 FINDINGS: MRI HEAD FINDINGS Brain: Cerebral volume within normal limits for age. No significant cerebral white matter changes. Few small remote cerebellar infarcts noted, right greater than left. No abnormal foci of restricted diffusion to suggest acute or subacute ischemia. Gray-white matter differentiation maintained. No other areas of remote cortical infarction. No acute or chronic intracranial hemorrhage. No mass lesion, midline shift or mass effect. No hydrocephalus. No extra-axial fluid collection. Normal pituitary gland. Vascular: Major intracranial vascular flow voids are maintained.  Skull and upper cervical spine: Craniocervical junction normal. No focal marrow replacing lesion. Scalp soft tissues unremarkable. Sinuses/Orbits: Globes and orbital soft tissues within normal limits. Paranasal sinuses are clear. Trace opacity bilateral mastoid air cells, of doubtful significance. Inner ear structures normal. Other: None. MRA HEAD FINDINGS ANTERIOR CIRCULATION: Distal cervical segments of the internal carotid arteries are widely patent with antegrade flow. Petrous, cavernous, and supraclinoid segments widely patent without stenosis. ICA termini widely patent. A1 segments widely patent. Right A1 hypoplastic, likely accounting for the slightly diminutive right ICA is compared to the left. Normal anterior communicating artery. Anterior cerebral arteries widely patent to their distal aspects without stenosis. M1 segments widely patent bilaterally. Normal MCA bifurcations. Distal MCA branches well perfused and symmetric. POSTERIOR CIRCULATION: Vertebral arteries patent to the vertebrobasilar junction without stenosis. Left vertebral artery dominant. Posterior inferior cerebral arteries patent bilaterally. Basilar widely patent to its distal aspect without stenosis. Short fenestration noted within the proximal basilar artery. Superior cerebral arteries patent bilaterally. Both of the posterior cerebral arteries supplied via the basilar and are well perfused to their distal aspects. No intracranial aneurysm. IMPRESSION: MRI HEAD IMPRESSION: 1. No acute intracranial abnormality. 2. Few small remote cerebellar infarcts, right greater than left. 3. Otherwise normal brain MRI. MRA  HEAD IMPRESSION: Normal intracranial MRA. Electronically Signed   By: Rise Mu M.D.   On: 04/21/2018 23:01   US Carotid Bilateral (at [facility] And [facility] Only)  Result Date: 04/21/2018 CLINICAL DATA:  TIA symptoms EXAM: BILATERAL CAROTID DUPLEX ULTRASOUND TECHNIQUE: Wallace Cullens scale imaging, color Doppler and duplex ultrasound were  performed of bilateral carotid and vertebral arteries in the neck. COMPARISON:  None. FINDINGS: Criteria: Quantification of carotid stenosis is based on velocity parameters that correlate the residual internal carotid diameter with NASCET-based stenosis levels, using the diameter of the distal internal carotid lumen as the denominator for stenosis measurement. The following velocity measurements were obtained: RIGHT ICA: 106/44 cm/sec CCA: 78/21 cm/sec SYSTOLIC ICA/CCA RATIO:  1.4 ECA: 82 cm/sec LEFT ICA: 125/49 cm/sec CCA: 84/21 cm/sec SYSTOLIC ICA/CCA RATIO:  1.5 ECA: 79 cm/sec RIGHT CAROTID ARTERY: Minor carotid intimal thickening without significant atherosclerosis. No hemodynamically significant right ICA stenosis, velocity elevation, or turbulent flow. Degree of narrowing less than 50%. RIGHT VERTEBRAL ARTERY:  Antegrade LEFT CAROTID ARTERY: Similar carotid intimal thickening without significant atherosclerosis. No hemodynamically significant left ICA stenosis, velocity elevation, or turbulent flow. LEFT VERTEBRAL ARTERY:  Antegrade IMPRESSION: Carotid intimal thickening without significant atherosclerosis. No hemodynamically significant ICA stenosis. Degree of narrowing less than 50% bilaterally by ultrasound criteria. Patent antegrade vertebral flow bilaterally Electronically Signed   By: Judie Petit.  Shick M.D.   On: 04/21/2018 16:32   Mr Maxine Glenn Head/brain [facility] Cm  Result Date: 04/21/2018 CLINICAL DATA:  Initial evaluation for left-sided numbness in weakness, TIA. EXAM: MRI HEAD WITHOUT CONTRAST MRA HEAD WITHOUT CONTRAST TECHNIQUE: Multiplanar, multiecho pulse sequences of the brain and surrounding structures were obtained without intravenous contrast. Angiographic images of the head were obtained using MRA technique without contrast. COMPARISON:  Prior CT from 04/21/2018 FINDINGS: MRI HEAD FINDINGS Brain: Cerebral volume within normal limits for age. No significant cerebral white matter changes. Few small remote  cerebellar infarcts noted, right greater than left. No abnormal foci of restricted diffusion to suggest acute or subacute ischemia. Gray-white matter differentiation maintained. No other areas of remote cortical infarction. No acute or chronic intracranial hemorrhage. No mass lesion, midline shift or mass effect. No hydrocephalus. No extra-axial fluid collection. Normal pituitary gland. Vascular: Major intracranial vascular flow voids are maintained. Skull and upper cervical spine: Craniocervical junction normal. No focal marrow replacing lesion. Scalp soft tissues unremarkable. Sinuses/Orbits: Globes and orbital soft tissues within normal limits. Paranasal sinuses are clear. Trace opacity bilateral mastoid air cells, of doubtful significance. Inner ear structures normal. Other: None. MRA HEAD FINDINGS ANTERIOR CIRCULATION: Distal cervical segments of the internal carotid arteries are widely patent with antegrade flow. Petrous, cavernous, and supraclinoid segments widely patent without stenosis. ICA termini widely patent. A1 segments widely patent. Right A1 hypoplastic, likely accounting for the slightly diminutive right ICA is compared to the left. Normal anterior communicating artery. Anterior cerebral arteries widely patent to their distal aspects without stenosis. M1 segments widely patent bilaterally. Normal MCA bifurcations. Distal MCA branches well perfused and symmetric. POSTERIOR CIRCULATION: Vertebral arteries patent to the vertebrobasilar junction without stenosis. Left vertebral artery dominant. Posterior inferior cerebral arteries patent bilaterally. Basilar widely patent to its distal aspect without stenosis. Short fenestration noted within the proximal basilar artery. Superior cerebral arteries patent bilaterally. Both of the posterior cerebral arteries supplied via the basilar and are well perfused to their distal aspects. No intracranial aneurysm. IMPRESSION: MRI HEAD IMPRESSION: 1. No acute  intracranial abnormality. 2. Few small remote cerebellar infarcts, right greater than left. 3. Otherwise  normal brain MRI. MRA HEAD IMPRESSION: Normal intracranial MRA. Electronically Signed   By: Rise MuBenjamin  McClintock M.D.   On: 04/21/2018 23:01     Management plans discussed with the patient, family and they are in agreement.  CODE STATUS: Full Code   TOTAL TIME TAKING CARE OF THIS PATIENT: 28 minutes.    Shaune PollackQing Cloe Sockwell M.D on 04/22/2018 at 11:11 AM  Between 7am to 6pm - Pager - [phone]  After 6pm go to [email or web address] - Scientist, research (life [email or web address])password [email or web address]  [facility] [place] [facility]  Office  [phone]  CC: Primary care physician; Shapely-Quinn, Desiree Lucyodd [place], MD   Note: This dictation was prepared with Dragon dictation along with smaller phrase technology. Any transcriptional errors that result from this process are unintentional.

## 2018-04-22 NOTE — Progress Notes (Signed)
Spoke with the Neurologist on call today, Dr Daisy BlossomAhearn via telephone call advising her that Dr Im[facility] Burnhen [facility] with agreeable with the neurologist; advised Dr Daisy BlossomAhearn that there are no pending tests to be done today; Dr [facility] and call me back to advise about the discharge status

## 2018-04-23 LAB — HIV ANTIBODY (ROUTINE TESTING W REFLEX): HIV Screen 4th Generation wRfx: NONREACTIVE

## 2018-06-26 DIAGNOSIS — I10 Essential (primary) hypertension: Secondary | ICD-10-CM | POA: Insufficient documentation

## 2018-06-26 NOTE — Progress Notes (Signed)
Cardiology Office Note  Date:  06/27/2018   ID:  Maria Rhodes, DOB [birth year], MRN [id number]  PCP:  Maria Lou, MD   Chief Complaint  Patient presents with  . other    ED TIA c/o left arm numbness and tingling. Pt complains about not being able to get medications due to pharmacy not having Rx on file to fill. Pt will be picking up meds today. Meds reviewed verbally with pt.    HPI:  Ms. Maria Rhodes is a 47 year old woman with past medical history of History of hypertension Anxiety Referred by Dr. Imogene Rhodes for TIA  Admission to the hospital August 2019 Was treated with Plavix, Lipitor MRI showed old stroke  Seen by neurology in the hospital  developing left sided numbness and weakness t his morning.  Numbness also noted on left sided of face with some associated  Change in speech.  Symptoms had improved on presentation.  tPA not administered.   TEE was recommended if echocardiogram is normal (this was not completed in the hospital) He was placed on aspirin 325  MRI IMPRESSION: MRI HEAD IMPRESSION: 1. No acute intracranial abnormality. 2. Few small remote cerebellar infarcts, right greater than left. 3. Otherwise normal brain MRI. MRA HEAD IMPRESSION: Normal intracranial MRA. Electronically Signed   By: Maria Rhodes M.D.   On: 04/21/2018 23:01   Carotid ultrasound IMPRESSION: Carotid intimal thickening without significant atherosclerosis. No hemodynamically significant ICA stenosis. Degree of narrowing less than 50% bilaterally by ultrasound criteria. Patent antegrade vertebral flow bilaterally Electronically Signed   By: Maria Petit.  Rhodes M.D.   On: 04/21/2018 16:32   EKG personally reviewed by myself on todays visit Shows normal sinus rhythm with rate 71 bpm diffuse T wave abnormality anterior precordial leads, inferior leads   PMH:   has a past medical history of Celiac disease, Heart murmur, HTN (hypertension), and TIA (transient ischemic  attack).  PSH:    Past Surgical History:  Procedure Laterality Date  . abdominal tissue removal     Minor    Current Outpatient Medications  Medication Sig Dispense Refill  . atorvastatin (LIPITOR) 40 MG tablet Take 1 tablet (40 mg total) by mouth daily at 6 PM. 30 tablet 1  . clonazePAM (KLONOPIN) 0.5 MG tablet Take 0.5 mg by mouth 2 (two) times daily as needed for anxiety.  0  . clopidogrel (PLAVIX) 75 MG tablet Take 1 tablet (75 mg total) by mouth daily. 30 tablet 1  . losartan (COZAAR) 100 MG tablet Take 50 mg by mouth daily.  5   No current facility-administered medications for this visit.      Allergies:   Asa [aspirin]   Social History:  The patient  reports that she has never smoked. She has never used smokeless tobacco.   Family History:   family history includes Heart attack in her maternal grandmother; Hypertension in her father and mother.    Review of Systems: Review of Systems  Constitutional: Negative.   Respiratory: Negative.   Cardiovascular: Negative.   Gastrointestinal: Negative.   Musculoskeletal: Negative.   Neurological: Positive for speech change.  Psychiatric/Behavioral: Negative.   All other systems reviewed and are negative.    PHYSICAL EXAM: VS:  BP 114/80 (BP Location: Right Arm, Patient Position: Sitting, Cuff Size: Normal)   Pulse 71   Ht 5\' 3"  (1.6 m)   Wt 147 lb 4 oz (66.8 kg)   BMI 26.08 kg/m  , BMI Body mass index is 26.08 kg/m.  GEN: Well nourished, well developed, in no acute distress  HEENT: normal  Neck: no JVD, carotid bruits, or masses Cardiac: RRR; no murmurs, rubs, or gallops,no edema  Respiratory:  clear to auscultation bilaterally, normal work of breathing GI: soft, nontender, nondistended, + BS MS: no deformity or atrophy  Skin: warm and dry, no rash Neuro:  Strength and sensation are intact Psych: euthymic mood, full affect    Recent Labs: 04/21/2018: ALT 21; BUN 9; Creatinine, Ser 0.85; Hemoglobin 11.1;  Platelets 352; Potassium 4.2; Sodium 139    Lipid Panel Lab Results  Component Value Date   CHOL 108 04/22/2018   HDL 29 (L) 04/22/2018   LDLCALC 67 04/22/2018   TRIG 60 04/22/2018      Wt Readings from Last 3 Encounters:  06/27/18 147 lb 4 oz (66.8 kg)  04/21/18 150 lb (68 kg)       ASSESSMENT AND PLAN:  TIA (transient ischemic attack) - Plan: EKG 12-Lead Prior strokes seen on MRI Recent hospitalization for new stroke Reviewed carotid ultrasound and MRI with her in detail No significant PAD Reviewed note from neurology recommending echocardiogram If this was normal request was to perform transesophageal echo We did spend some time discussing arrhythmia, atrial fibrillation and possible need for reveal device We will start with transthoracic echo, if this is normal we then probably proceed with transesophageal echo with reveal  Essential hypertension Blood pressure running low Suspect she will need losartan 25 mg daily if not no medication She will monitor blood pressure at home on losartan 50 before dropping down to 25 daily  Disposition:   F/U  1 month  Patient was seen in consultation for Dr. Johny Rhodes will be referred back to his office for ongoing care of the issues detailed above   Total encounter time more than 60 minutes  Greater than 50% was spent in counseling and coordination of care with the patient    Orders Placed This Encounter  Procedures  . EKG 12-Lead     Signed, Maria Rhodes, M.D., Ph.D. 06/27/2018  [facility] [place], [place] [phone]

## 2018-06-27 ENCOUNTER — Encounter: Payer: Self-pay | Admitting: Cardiovascular Disease

## 2018-06-27 ENCOUNTER — Ambulatory Visit: Payer: 59 | Admitting: Cardiovascular Disease

## 2018-06-27 VITALS — BP 114/80 | HR 71 | Ht 63.0 in | Wt 147.2 lb

## 2018-06-27 DIAGNOSIS — G459 Transient cerebral ischemic attack, unspecified: Secondary | ICD-10-CM

## 2018-06-27 DIAGNOSIS — I10 Essential (primary) hypertension: Secondary | ICD-10-CM | POA: Diagnosis not present

## 2018-06-27 NOTE — Patient Instructions (Addendum)
Medication Instructions:   Please hold the lipitor/atorvastatin (cholesterol pilll) Stay on plavix/clopidogrel (thinner) Consider decreasing the losartan down to 25 mg daily if blood pressure runs low  Labwork:  No new labs needed  Testing/Procedures:  We will order an echocardiogram for TIAs/CVA   Follow-Up: It was a pleasure seeing you in the office today. Please call us if you have new issues that need to be addressed before your next appt.  [phone]  Your physician wants you to follow-up in:  We will call after your echo  If you need a refill on your cardiac medications before your next appointment, please call your pharmacy.  For educational health videos Log in to : [email or web address] Or : [email or web address], password : [email or web address]

## 2018-06-30 ENCOUNTER — Other Ambulatory Visit: Payer: Self-pay

## 2018-06-30 ENCOUNTER — Ambulatory Visit (INDEPENDENT_AMBULATORY_CARE_PROVIDER_SITE_OTHER): Payer: 59

## 2018-06-30 DIAGNOSIS — G459 Transient cerebral ischemic attack, unspecified: Secondary | ICD-10-CM

## 2019-01-15 IMAGING — MR MR MRA HEAD W/O CM
9 of 11 series · 34 of 48 positions shown · non-contrast
Comparison: Prior CT from 04/21/2018

CLINICAL DATA: Initial evaluation for left-sided numbness in
weakness, TIA.

EXAM:
MRI HEAD WITHOUT CONTRAST
MRA HEAD WITHOUT CONTRAST
TECHNIQUE: Multiplanar, multiecho pulse sequences of the brain and surrounding
structures were obtained without intravenous contrast. Angiographic
images of the head were obtained using MRA technique without
contrast.

[Series 3: DWI · axial · 3.0mm · 1.80mm/px · z∈[-85,+77]mm · 4 of 55 slices shown (1 of 2)]
[im 1/55]
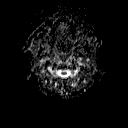
[im 19/55]
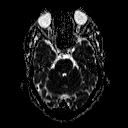
[im 37/55]
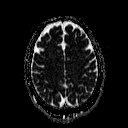
[im 55/55]
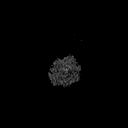

[Series 5: DWI · coronal · 3.0mm · 1.80mm/px · 3 of 49 slices shown (2 of 2)]
[im 1/49]
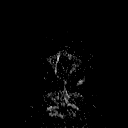
[im 25/49]
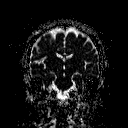
[im 49/49]
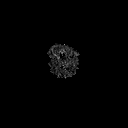

[Series 6: TOF · axial · non-contrast · 0.7mm · 0.37mm/px · z∈[-69,+17]mm · 7 of 140 slices shown]
[im 1/140]
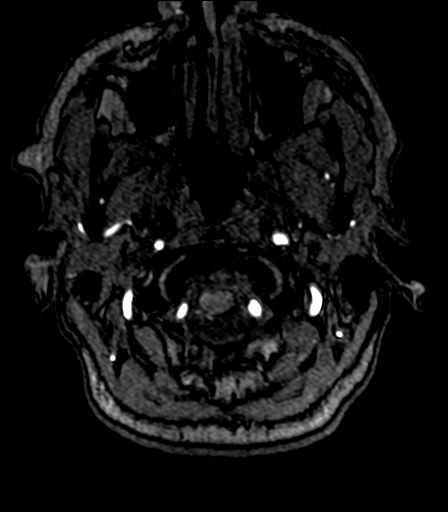
[im 16/140]
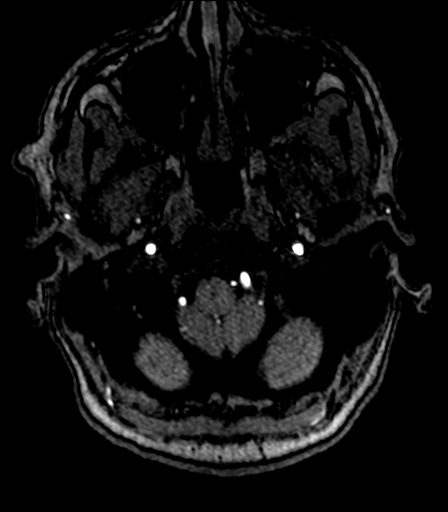
[im 47/140]
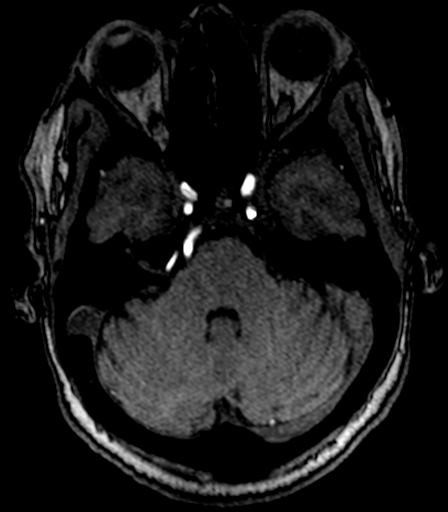
[im 62/140]
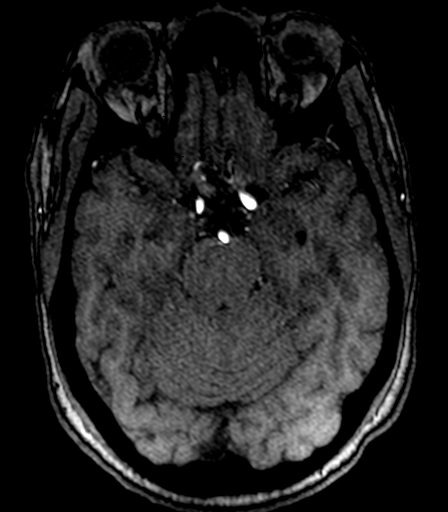
[im 78/140]
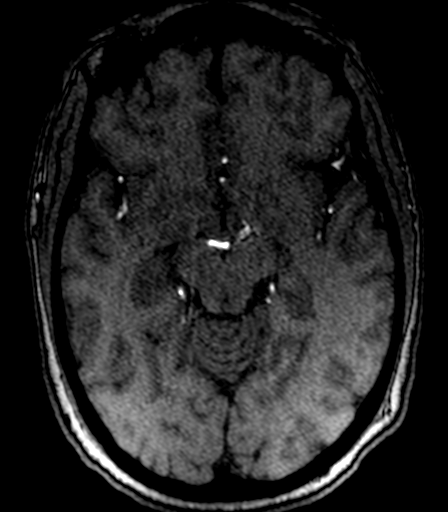
[im 93/140]
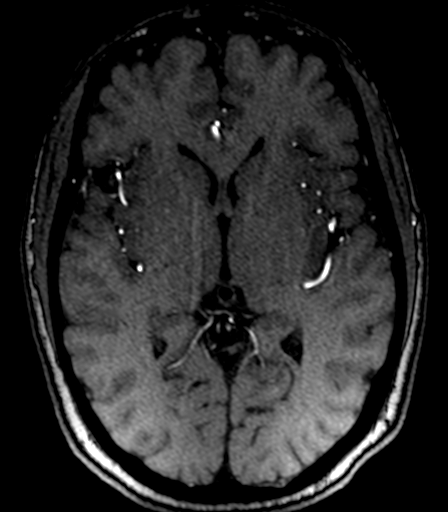
[im 124/140]
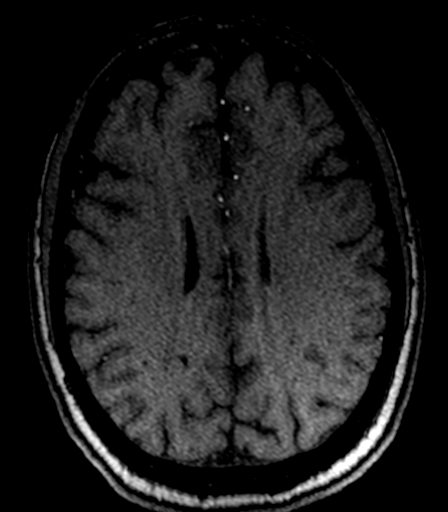

[Series 11: T1 · sagittal · 5.0mm · 0.45mm/px · 2 of 27 slices shown (1 of 2)]
[im 1/27]
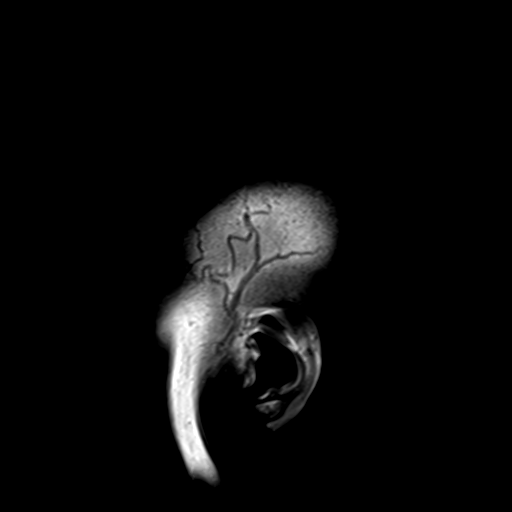
[im 27/27]
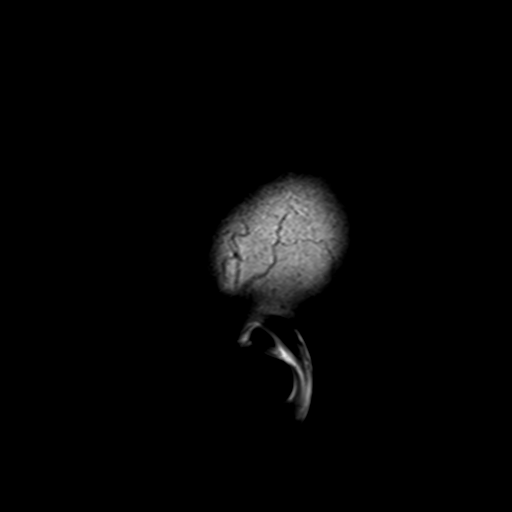

[Series 12: T2 · axial · 5.0mm · 0.60mm/px · z∈[-89,+80]mm · 2 of 27 slices shown (1 of 3)]
[im 1/27]
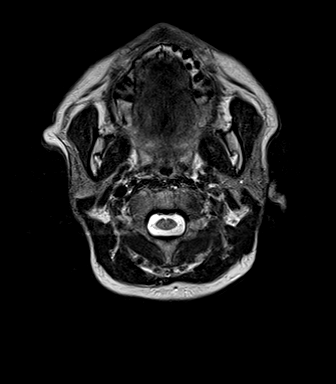
[im 27/27]
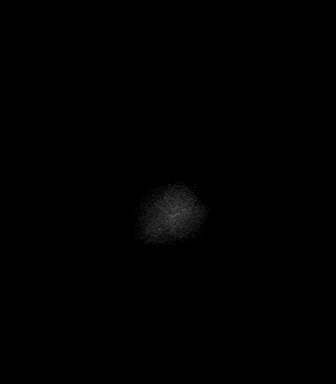

[Series 13: FLAIR · axial · 3.0mm · 0.45mm/px · z∈[-87,+78]mm · 4 of 56 slices shown]
[im 1/56]
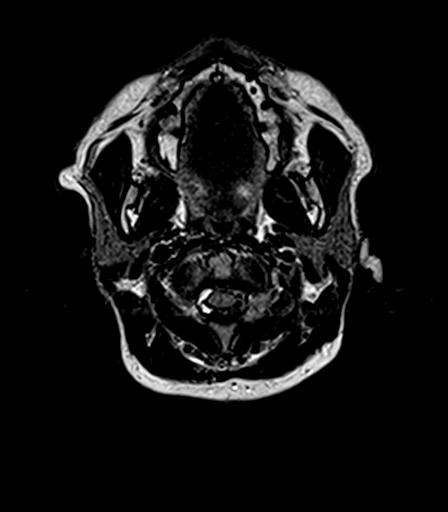
[im 19/56]
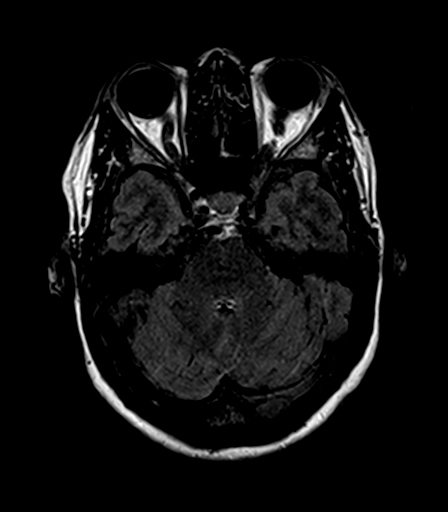
[im 37/56]
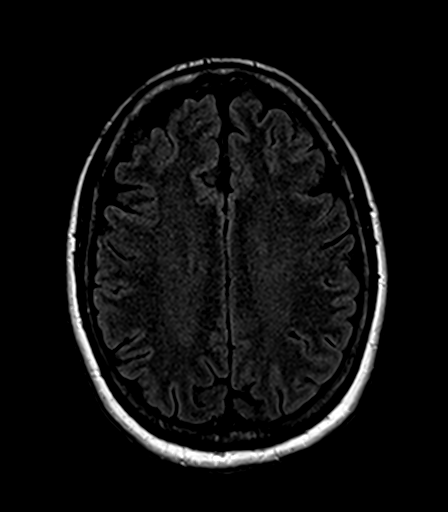
[im 56/56]
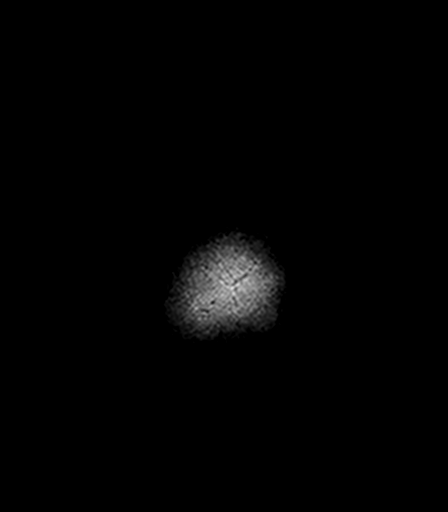

[Series 14: T2 · axial · 5.0mm · 0.45mm/px · z∈[-89,+80]mm · 2 of 27 slices shown (2 of 3)]
[im 1/27]
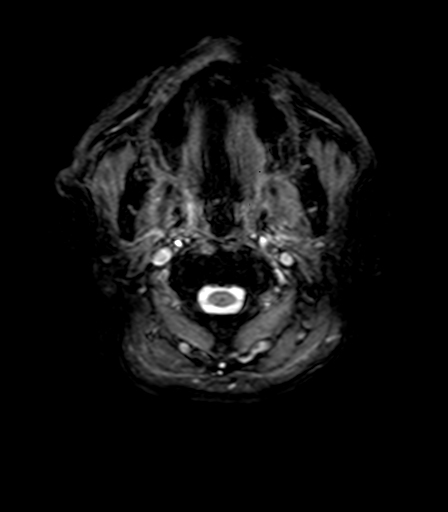
[im 27/27]
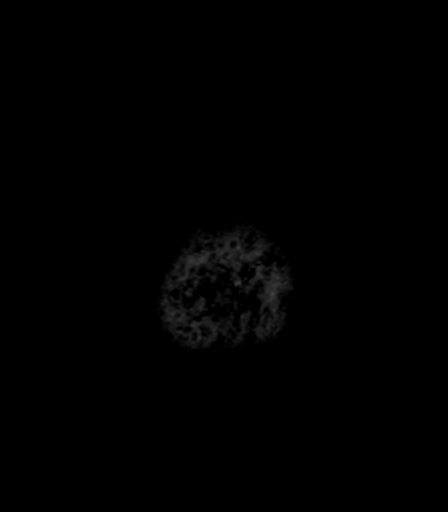

[Series 15: T1 · axial · 1.0mm · 1.00mm/px · z∈[-92,+83]mm · 8 of 176 slices shown (2 of 2)]
[im 1/176]
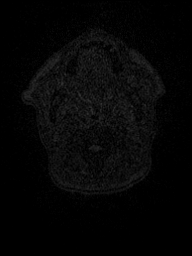
[im 32/176]
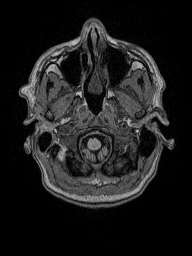
[im 48/176]
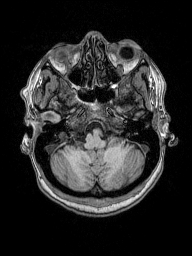
[im 80/176]
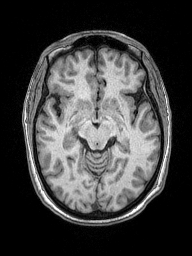
[im 96/176]
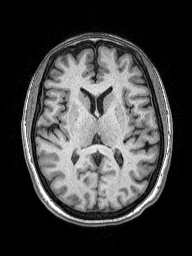
[im 128/176]
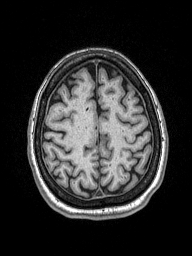
[im 144/176]
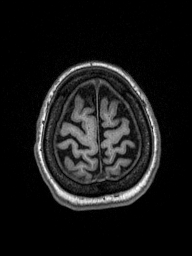
[im 176/176]
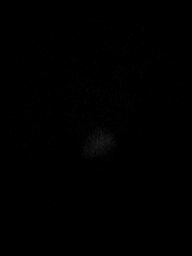

[Series 16: T2 · coronal · 5.0mm · 0.49mm/px · 2 of 30 slices shown (3 of 3)]
[im 1/30]
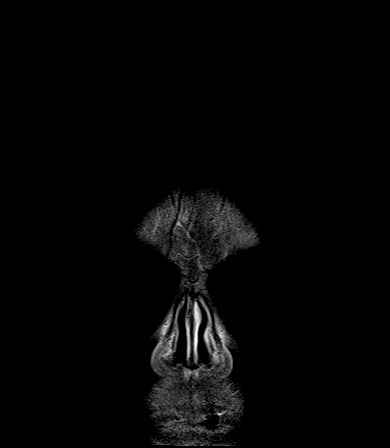
[im 30/30]
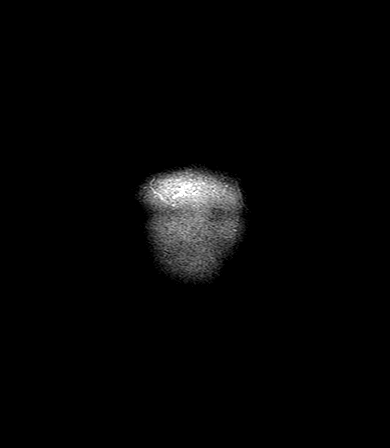

[34 of 48 positions shown; findings below may reference images not displayed]

FINDINGS: MRI HEAD FINDINGS

Brain: Cerebral volume within normal limits for age. No significant
cerebral white matter changes. Few small remote cerebellar infarcts
noted, right greater than left. No abnormal foci of restricted
diffusion to suggest acute or subacute ischemia. Gray-white matter
differentiation maintained. No other areas of remote cortical
infarction. No acute or chronic intracranial hemorrhage.

No mass lesion, midline shift or mass effect. No hydrocephalus. No
extra-axial fluid collection. Normal pituitary gland.

Vascular: Major intracranial vascular flow voids are maintained.

Skull and upper cervical spine: Craniocervical junction normal. No
focal marrow replacing lesion. Scalp soft tissues unremarkable.

Sinuses/Orbits: Globes and orbital soft tissues within normal
limits. Paranasal sinuses are clear. Trace opacity bilateral mastoid
air cells, of doubtful significance. Inner ear structures normal.

Other: None.

MRA HEAD FINDINGS

ANTERIOR CIRCULATION:

Distal cervical segments of the internal carotid arteries are widely
patent with antegrade flow. Petrous, cavernous, and supraclinoid
segments widely patent without stenosis. ICA termini widely patent.
A1 segments widely patent. Right A1 hypoplastic, likely accounting
for the slightly diminutive right ICA is compared to the left.
Normal anterior communicating artery. Anterior cerebral arteries
widely patent to their distal aspects without stenosis. M1 segments
widely patent bilaterally. Normal MCA bifurcations. Distal MCA
branches well perfused and symmetric.

POSTERIOR CIRCULATION:

Vertebral arteries patent to the vertebrobasilar junction without
stenosis. Left vertebral artery dominant. Posterior inferior
cerebral arteries patent bilaterally. Basilar widely patent to its
distal aspect without stenosis. Short fenestration noted within the
proximal basilar artery. Superior cerebral arteries patent
bilaterally. Both of the posterior cerebral arteries supplied via
the basilar and are well perfused to their distal aspects.

No intracranial aneurysm.
IMPRESSION: MRI HEAD IMPRESSION:

1. No acute intracranial abnormality.
2. Few small remote cerebellar infarcts, right greater than left.
3. Otherwise normal brain MRI.

MRA HEAD IMPRESSION:

Normal intracranial MRA.

## 2019-03-14 IMAGING — US US CAROTID DUPLEX BILAT
1 series · 13 of 24 positions shown · non-contrast
Comparison: None.

CLINICAL DATA: TIA symptoms

EXAM:
BILATERAL CAROTID DUPLEX ULTRASOUND
TECHNIQUE: Gray scale imaging, color Doppler and duplex ultrasound were
performed of bilateral carotid and vertebral arteries in the neck.

[Series 1: us carotid duplex bilat · 0.05mm/px · 13 of 67 slices shown]
[im 1/67]
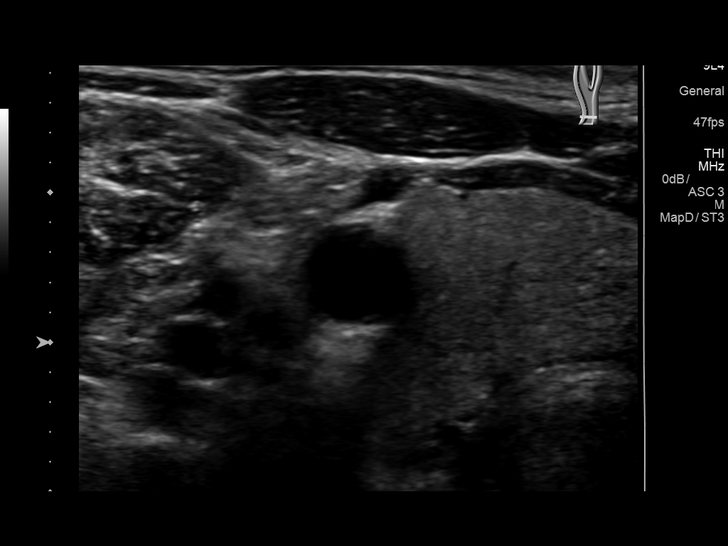
[im 6/67]
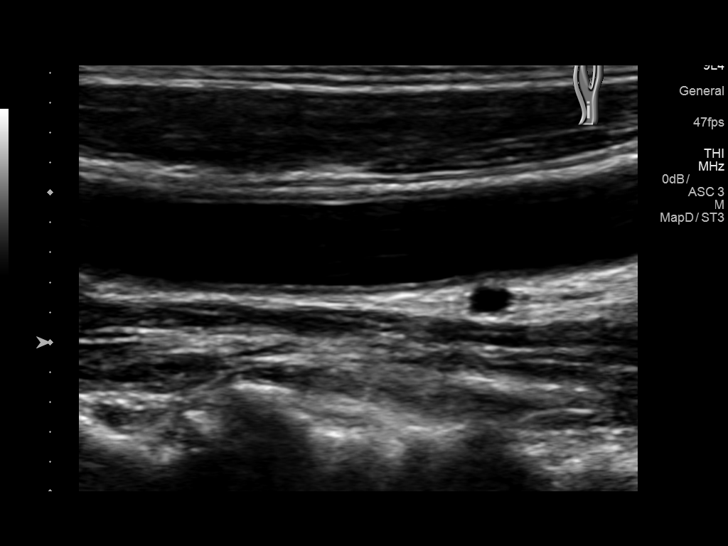
[im 12/67]
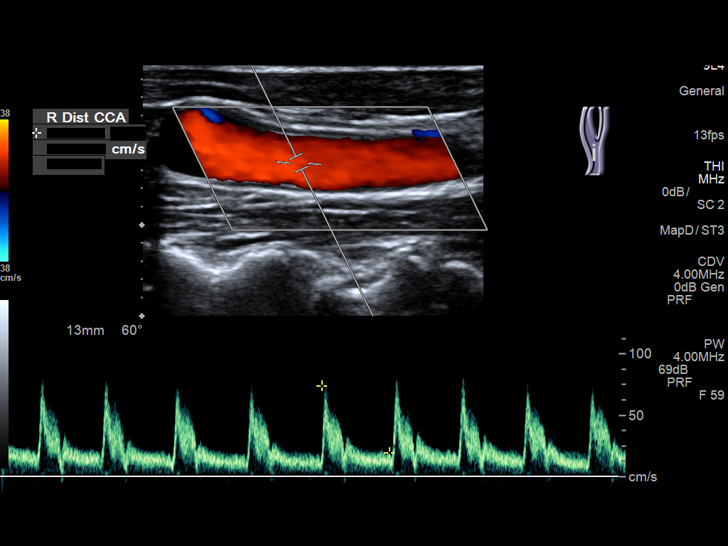
[im 18/67]
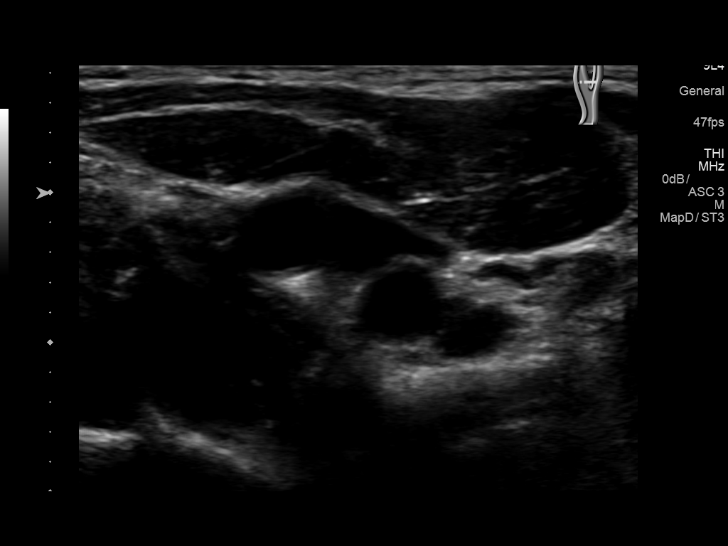
[im 23/67]
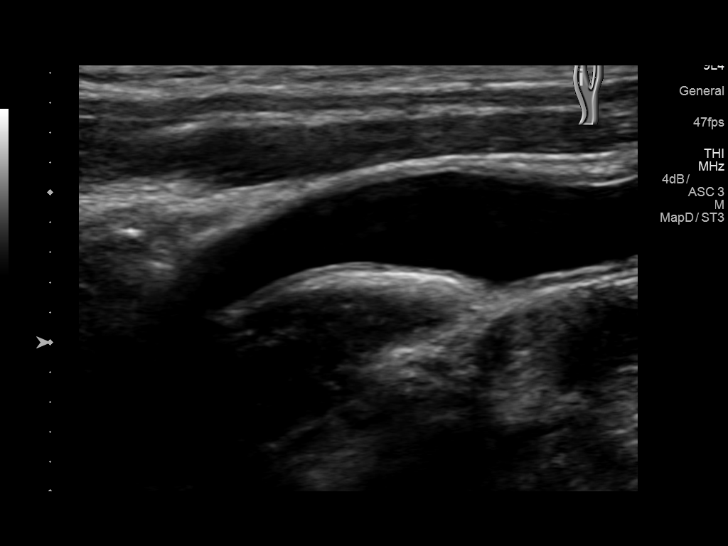
[im 29/67]
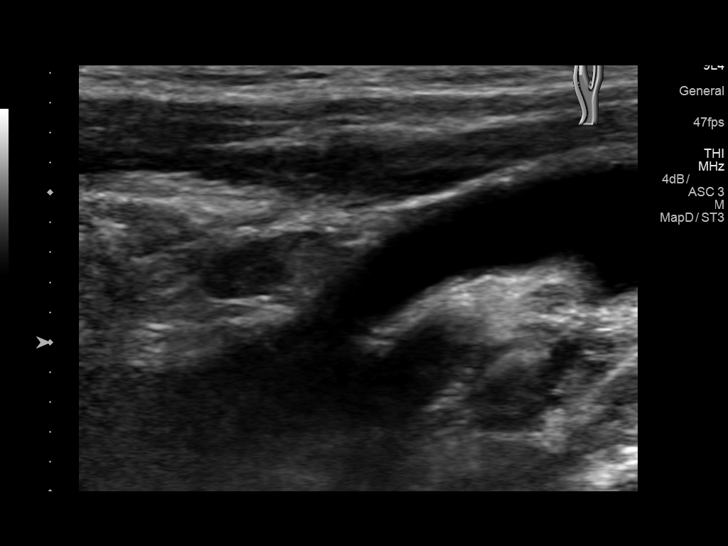
[im 35/67]
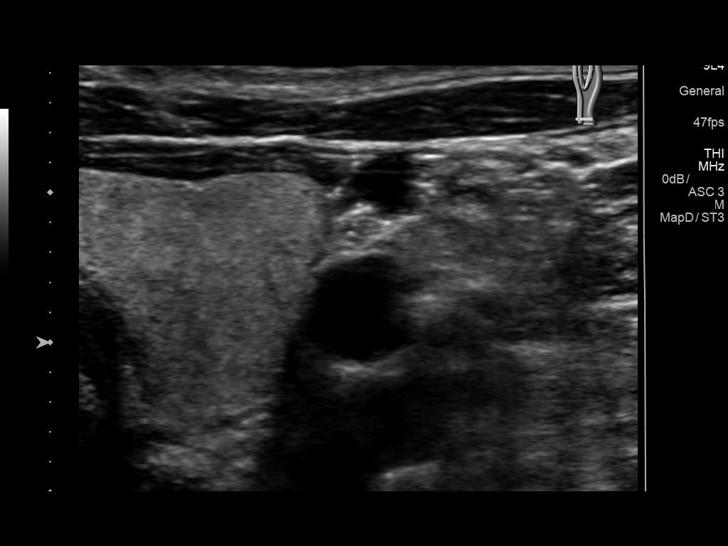
[im 38/67]
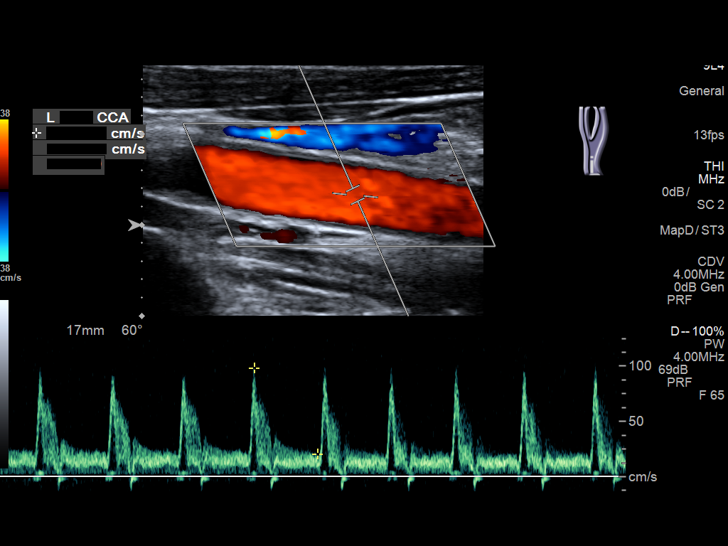
[im 44/67]
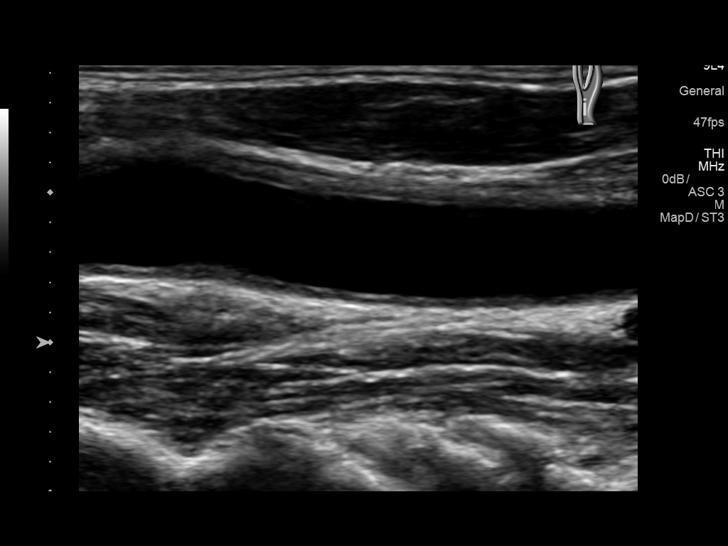
[im 49/67]
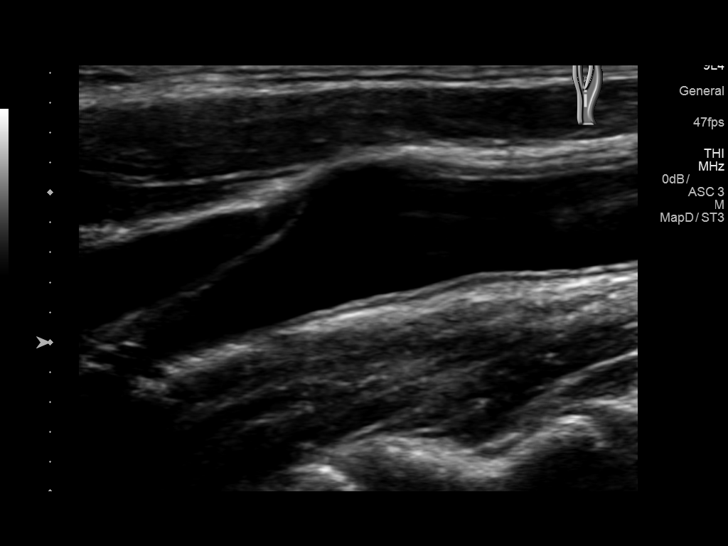
[im 55/67]
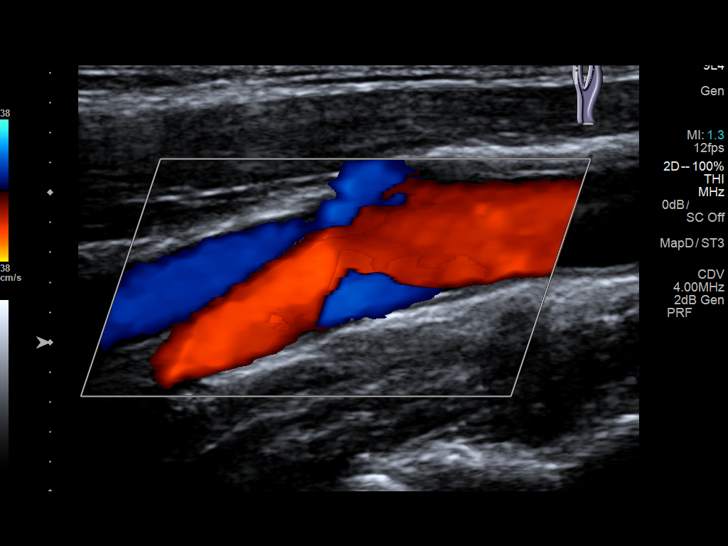
[im 61/67]
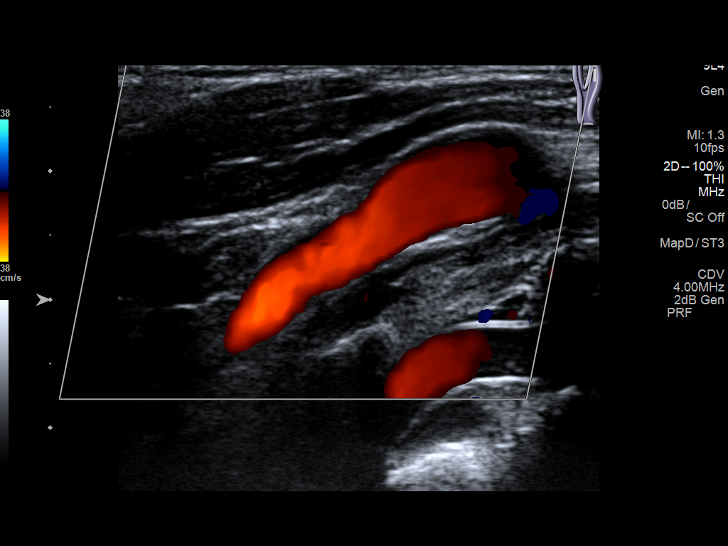
[im 67/67]
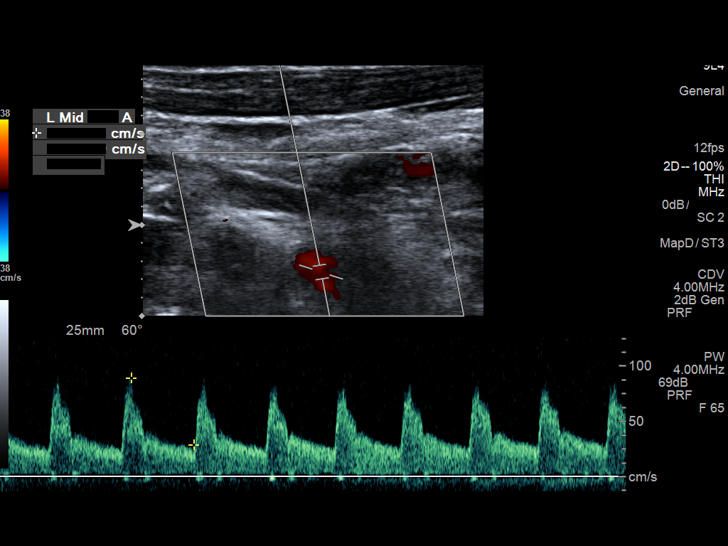

[13 of 24 positions shown; findings below may reference images not displayed]

FINDINGS: Criteria: Quantification of carotid stenosis is based on velocity
parameters that correlate the residual internal carotid diameter
with NASCET-based stenosis levels, using the diameter of the distal
internal carotid lumen as the denominator for stenosis measurement.

The following velocity measurements were obtained:

RIGHT

ICA: 106/44 cm/sec

CCA: 78/21 cm/sec

SYSTOLIC ICA/CCA RATIO:

ECA: 82 cm/sec

LEFT

ICA: 125/49 cm/sec

CCA: 84/21 cm/sec

SYSTOLIC ICA/CCA RATIO:

ECA: 79 cm/sec

RIGHT CAROTID ARTERY: Minor carotid intimal thickening without
significant atherosclerosis. No hemodynamically significant right
ICA stenosis, velocity elevation, or turbulent flow. Degree of
narrowing less than 50%.

RIGHT VERTEBRAL ARTERY:  Antegrade

LEFT CAROTID ARTERY: Similar carotid intimal thickening without
significant atherosclerosis. No hemodynamically significant left ICA
stenosis, velocity elevation, or turbulent flow.

LEFT VERTEBRAL ARTERY:  Antegrade
IMPRESSION: Carotid intimal thickening without significant atherosclerosis. No
hemodynamically significant ICA stenosis. Degree of narrowing less
than 50% bilaterally by ultrasound criteria.

Patent antegrade vertebral flow bilaterally

## 2023-05-25 NOTE — Progress Notes (Signed)
 Chief Complaint:   Chief Complaint  Patient presents with   Medication Refill   Abdominal Pain    Epigastric region x3 days increased in the past day patient has celiac disease     Subjective:   Maria Rhodes is a 52 y.o. female patient in today for:  Epigastric pain Started 3 days ago. Keeps her awake at night. Rare alcohol. No hx of GERD. Notices pain is worse when getting out of car. Some pain with coughing. No fevers, chills, nausea, vomiting, diarrhea, constipation.  Celiac disease Diagnosed by visualization and biopsies. Doesn't follow completely gluten free diet. Recommended repeat EGD  HTN Losartan  25 mg QD Controlled. Not checking BP at home. Denies chest pain, palpitations, dyspnea on exertion, headaches, vision changes, LE edema.  GAD Panic disorder Sertraline 50 mg Panic attacks have improved since starting SSRI.  Still occurring infrequently.  History of TIA Clopidogrel  for the past 5 years. No episodes since then. Still taking statin.  Weight gain Gained 20 lbs in the last year. Trying to cut out soda. Admits to eating a lot of bread.  Past medical history, social history, surgical history, family history, allergy history, and medications reviewed and updated as needed.     Patient Active Problem List  Diagnosis   Generalized anxiety disorder   Essential hypertension, benign   TIA (transient ischemic attack)    Social Determinants of Health with Concerns   Depression: Mild depression (04/24/2020)   PHQ-9    PHQ-9 Score: 6      * No data to display          Outpatient Medications Prior to Visit  Medication Sig Dispense Refill   atorvastatin  (LIPITOR) 40 MG tablet Take 1 tablet (40 mg total) by mouth once daily 30 tablet 11   clopidogreL  (PLAVIX ) 75 mg tablet take 1 tablet by mouth every day 30 tablet 0   losartan  (COZAAR ) 25 MG tablet take 1 tablet by mouth every day 30 tablet 2   sertraline (ZOLOFT) 50 MG tablet  take 1 tablet by mouth every day 30 tablet 0   No facility-administered medications prior to visit.    Review of Systems  All other systems reviewed and are negative.    Objective:   Vitals:   05/25/23 1005  BP: 132/85  Pulse: 80  Temp: 37.1 C (98.7 F)  TempSrc: Oral  SpO2: 98%  Weight: 96.5 kg (212 lb 11.9 oz)  Height: 160 cm (5' 3)  PainSc:   5  PainLoc: Abdomen   Body mass index is 37.69 kg/m. Home Vitals:     Physical Exam Constitutional:      General: She is not in acute distress.    Appearance: Normal appearance. She is not ill-appearing or toxic-appearing.  HENT:     Head: Normocephalic and atraumatic.     Right Ear: External ear normal.     Left Ear: External ear normal.     Nose: Nose normal. No rhinorrhea.     Mouth/Throat:     Mouth: Mucous membranes are moist.     Pharynx: Oropharynx is clear. No posterior oropharyngeal erythema.  Eyes:     Extraocular Movements: Extraocular movements intact.     Conjunctiva/sclera: Conjunctivae normal.     Pupils: Pupils are equal, round, and reactive to light.  Cardiovascular:     Rate and Rhythm: Normal rate and regular rhythm.     Heart sounds: Normal heart sounds. No murmur heard. Pulmonary:     Effort: Pulmonary  effort is normal. No respiratory distress.     Breath sounds: Normal breath sounds. No wheezing.  Abdominal:     General: Abdomen is flat. Bowel sounds are normal. There is no distension.     Palpations: Abdomen is soft. There is no mass.     Tenderness: There is abdominal tenderness. There is no guarding.     Comments: Tender to palpation epigastric and LUQ along rib  Musculoskeletal:        General: No tenderness.     Right lower leg: No edema.     Left lower leg: No edema.  Skin:    General: Skin is warm and dry.     Findings: No rash.  Neurological:     General: No focal deficit present.     Mental Status: She is alert and oriented to person, place, and time.  Psychiatric:        Mood  and Affect: Mood normal.        Behavior: Behavior normal.      Assessment/Plan:   Diagnoses and all orders for this visit:  Generalized anxiety disorder Panic anxiety syndrome -     sertraline (ZOLOFT) 50 MG tablet; Take 1 tablet (50 mg total) by mouth once daily for 180 days - Mood stable.  Panic attacks very minimal.  Controlled.  Essential hypertension, benign -     Follow up in Primary Care; Future -     losartan  (COZAAR ) 25 MG tablet; Take 1 tablet (25 mg total) by mouth once daily for 180 days - BP controlled.  Encouraged to check periodically at home. - Discussed need for weight loss  Hx of TIA (transient ischemic attack) -     Complete Blood Count (CBC); Future -     clopidogreL  (PLAVIX ) 75 mg tablet; Take 1 tablet (75 mg total) by mouth once daily for 180 days - No TIA in 5 years. - Will discuss need for chronic Plavix  in the future.  Flu vaccine need -     Flu Vaccine IIV3, IM PF (23MO+)(Fluarix, Flulaval, Fluzone)  Obesity (BMI 35.0-39.9 without comorbidity), unspecified -     Lipid Panel W/Reflex Direct Low Density Lipoprotein (LDL) Cholesterol; Future -     Comprehensive Metabolic Panel (CMP); Future -     Hemoglobin A1C; Future - Recommend 150 mins moderate exercise per week, should include 2 days of weight exercises. -  Recommend mediterranean diet.  Acute epigastric pain -     Follow up in Primary Care; Future -     Lipase; Future - Suspect MSK in nature based on history.  Will check labs. - Encouraged heat as needed, gentle stretching. - Tylenol /Motrin as needed if not contraindicated. - Return precautions discussed  Hyperlipidemia, unspecified hyperlipidemia type -     atorvastatin  (LIPITOR) 40 MG tablet; Take 1 tablet (40 mg total) by mouth once daily for 180 days - Encouraged weight loss     I spent a total of in both face-to-face and non-face-to-face activities, excluding procedures performed, for this visit on the date of this  encounter.         Follow-up     Future Labs/Procedures Expected by Expires   Follow up in Primary Care [[id number] Custom]  08/24/2023 07/24/2024   Scheduling Instructions:   With pap   Questions:     Does this order need to be coordinated with another visit or should it be hidden from the patient portal? If yes to either, the patient will need  to stop by the front desk or call to schedule.: No   Who is this follow-up with?: PCP   What type of follow up is needed?: Physical   What's the reason for follow up?:          Future Appointments   This patient does not currently have any appointments scheduled.     Patient Instructions  - Recommend 150 mins moderate exercise per week, should include 2 days of weight exercises. - Recommend mediterranean diet.   An after visit summary was provided for the patient either in written format (printed) or through MyChart.  *Some images could not be shown.

## 2023-11-10 NOTE — Progress Notes (Signed)
 Chief Complaint:   Chief Complaint  Patient presents with   Ankle Pain    X 1 month    Subjective  Maria Rhodes is a 53 y.o. female who presents for Ankle Pain (X 1 month) History of Present Illness Maria Rhodes is a 53 year old female who presents with right ankle swelling and pain.  Right ankle pain/swelling She has been experiencing right ankle swelling and pain for about a month without recollection of a specific injury.  The ankle is swollen and occasionally appears bruised.  There is no pain in her calves or redness in the area.  She uses a brace and a cold pack, which provides some relief, although the cold pack requires her to be seated.  The pain primarily affects her walking, but she remains active and walks regularly despite the discomfort. She mentions a past episode where her sciatica was triggered, and her ankle hurt at that time.  Treatment for sciatica previously helped alleviate the ankle pain temporarily. She is on Plavix , which she believes causes her to bruise easily, but she does not recall any specific trauma to the ankle.  She has been using a foot product that dried out her skin, leading to some cuts and dry skin on her feet, which she has been picking at.  No pain in her calves, redness, or additional symptoms such as chest pain or shortness of breath.  Review of Systems  All other systems reviewed and are negative.   Patient Active Problem List  Diagnosis   Generalized anxiety disorder   Essential hypertension, benign   TIA (transient ischemic attack)   Prediabetes   Obesity (BMI 30.0-34.9)   Constipation   Panic anxiety syndrome    Outpatient Medications Prior to Visit  Medication Sig Dispense Refill   atorvastatin  (LIPITOR) 40 MG tablet Take 1 tablet (40 mg total) by mouth once daily for 180 days 90 tablet 1   clopidogreL  (PLAVIX ) 75 mg tablet Take 1 tablet (75 mg total) by mouth once daily for 180 days 90 tablet 1   losartan   (COZAAR ) 25 MG tablet TAKE 1 TABLET (25 MG TOTAL) BY MOUTH ONCE DAILY FOR 180 DAYS 30 tablet 5   sertraline (ZOLOFT) 50 MG tablet Take 1.5 tablets (75 mg total) by mouth once daily for 180 days 90 tablet 1   No facility-administered medications prior to visit.      Objective  Vitals:   11/10/23 1604  BP: 125/87  Pulse: 76  SpO2: 100%  Weight: 96.5 kg (212 lb 11.9 oz)  PainSc:   8  PainLoc: Ankle   Body mass index is 35.75 kg/m.  Home Vitals:     Physical Exam Vitals reviewed.  Constitutional:      General: She is not in acute distress.    Appearance: Normal appearance. She is not ill-appearing or toxic-appearing.  Eyes:     Extraocular Movements: Extraocular movements intact.     Conjunctiva/sclera: Conjunctivae normal.  Cardiovascular:     Rate and Rhythm: Normal rate and regular rhythm.     Pulses: Normal pulses.     Heart sounds: Normal heart sounds. No murmur heard. Pulmonary:     Effort: Pulmonary effort is normal. No respiratory distress.     Breath sounds: Normal breath sounds. No wheezing.  Abdominal:     General: Abdomen is flat. Bowel sounds are normal.     Palpations: Abdomen is soft.  Musculoskeletal:        General: Swelling and tenderness  present.     Comments: Normal ROM of right ankle. It does appear mildly swollen. No redness or warmth. Normal strength against resistance in all directions. Tenderness to palpation over lateral malleolus.  Skin:    General: Skin is warm and dry.     Findings: No bruising or erythema.     Comments: Patient has been picking callused skin of bottom of foot.  Neurological:     Mental Status: She is alert.    Physical Exam    Results      Assessment/Plan:   Assessment & Plan Right ankle sprain Right ankle swelling and pain for one month, likely sprain. No specific injury recalled. Good strength suggests no tear. Bruising possibly exacerbated by Plavix . Differential includes sprain or other soft tissue  injury. - Order x-ray of right ankle, three plus views to rule out fracture. - Recommend Tylenol , 1000 mg twice daily for 14 days due to contraindication of ibuprofen with Plavix . - Advise use of ice pack for 20 minutes multiple times daily. - Recommend ankle brace or sleeve for compression. - Provide exercises for ankle sprain. - Advise elevation of leg above heart to reduce swelling. - Instruct to return if calf or foot pain or redness occurs. - Discuss potential for physical therapy or further imaging if no improvement.  Reviewed Xray personally agree with radiologist reed, no acute fracture noted.  Skin dryness and irritation on feet Dry skin on feet with evidence of picking, leading to blood spots. - Advise against picking at dry skin to prevent infection. - Recommend use of a pumice stone to file callused skin. - Suggest moisturizing feet regularly.  Diagnoses and all orders for this visit:  Acute right ankle pain -     X-ray ankle right 3 plus views; Future  Ankle swelling, right -     X-ray ankle right 3 plus views; Future        This visit was coded based on medical decision making (MDM).        Return if symptoms worsen or fail to improve.   Future Appointments     Date/Time Provider Department Center Visit Type   02/22/2024 1:40 PM (Arrive by 1:25 PM) Claudia Charleston, MD [facility] OFFICE VISIT       There are no Patient Instructions on file for this visit.  An after visit summary was provided for the patient either in written format (printed) or through MyChart.  This note has been created using automated tools and reviewed for accuracy by [employer].

## 2024-08-21 ENCOUNTER — Emergency Department

## 2024-08-21 ENCOUNTER — Emergency Department
Admission: EM | Admit: 2024-08-21 | Discharge: 2024-08-21 | Disposition: A | Discharge status: Home / Self Care | Attending: Emergency Medicine | Admitting: Emergency Medicine

## 2024-08-21 ENCOUNTER — Other Ambulatory Visit: Payer: Self-pay

## 2024-08-21 DIAGNOSIS — I471 Supraventricular tachycardia, unspecified: Secondary | ICD-10-CM

## 2024-08-21 DIAGNOSIS — R0789 Other chest pain: Secondary | ICD-10-CM | POA: Diagnosis present

## 2024-08-21 DIAGNOSIS — I10 Essential (primary) hypertension: Secondary | ICD-10-CM | POA: Diagnosis not present

## 2024-08-21 LAB — BASIC METABOLIC PANEL WITH GFR
Anion gap: 11 (ref 5–15)
BUN: 14 mg/dL (ref 6–20)
CO2: 23 mmol/L (ref 22–32)
Calcium: 9 mg/dL (ref 8.9–10.3)
Chloride: 106 mmol/L (ref 98–111)
Creatinine, Ser: 0.84 mg/dL (ref 0.44–1.00)
GFR, Estimated: >60 mL/min (ref >60)
Glucose, Bld: 87 mg/dL (ref 70–99)
Potassium: 4.4 mmol/L (ref 3.5–5.1)
Sodium: 140 mmol/L (ref 135–145)

## 2024-08-21 LAB — CBC
HCT: 44 % (ref 36.0–46.0)
Hemoglobin: 14.6 g/dL (ref 12.0–15.0)
MCH: 29.1 pg (ref 26.0–34.0)
MCHC: 33.2 g/dL (ref 30.0–36.0)
MCV: 87.8 fL (ref 80.0–100.0)
Platelets: 255 K/uL (ref 150–400)
RBC: 5.01 MIL/uL (ref 3.87–5.11)
RDW: 14.8 % (ref 11.5–15.5)
WBC: 6.1 K/uL (ref 4.0–10.5)
nRBC: 0 % (ref 0.0–0.2)

## 2024-08-21 LAB — TROPONIN T, HIGH SENSITIVITY
Troponin T High Sensitivity: 66 ng/L — ABNORMAL HIGH (ref 0–19)
Troponin T High Sensitivity: 63 ng/L — ABNORMAL HIGH (ref 0–19)

## 2024-08-21 MED ORDER — METOPROLOL TARTRATE 25 MG PO TABS: 12.5000 mg | ORAL_TABLET | Freq: Two times a day (BID) | ORAL | 0 refills | Status: AC

## 2024-08-21 MED ORDER — LACTATED RINGERS IV BOLUS
1000.0000 mL | Freq: Once | INTRAVENOUS | Status: AC
  Administered 2024-08-21: 14:00:00 1000 mL via INTRAVENOUS

## 2024-08-21 MED ORDER — ONDANSETRON HCL 4 MG/2ML IJ SOLN
4.0000 mg | Freq: Once | INTRAMUSCULAR | Status: AC
  Administered 2024-08-21: 16:00:00 4 mg via INTRAVENOUS
  Filled 2024-08-21: qty 2

## 2024-08-21 NOTE — ED Provider Notes (Signed)
°  Physical Exam  BP 110/81 (BP Location: Left Arm)   Pulse 86   Temp 99.1 F (37.3 C) (Oral)   Resp 17   Wt 66.8 kg   SpO2 98%   BMI 26.09 kg/m   Physical Exam  Procedures  Procedures  ED Course / MDM   Clinical Course as of 08/21/24 2153  Tue Aug 21, 2024  1500 S/o from Dr. Dorothyann - chest tightness since last night, originally appeared to be SVT, spontaneously converted  TO DO: - f/u labs, re-eval -- tentative DC home if trop reassuring, sx since last  [MM]  1555 Cbc wnl [MM]  1644 Chest x-ray interpreted by myself and radiology report reviewed.  No acute pathology identified [MM]  1734 Bmp wnl [MM]  1812 Troponin mildly elevated at 66, currently symptomatic, will repeat [MM]  2107 Rpt trop downtrending [MM]  2142 Reevaluated, remains in sinus rhythm here, reassured by stable lab testing.  No longer established with a cardiologist, will place referral.  Suspect mild troponin elevation secondary to having been in SVT for greater than 12 hours though no evidence of acute pathology at this time.  Strict ED return precautions in place.  Patient does states she is having these episodes multiple times a month and is requesting a medication to help decrease the frequency-I will start her on a low-dose of metoprolol  (12.5 mg twice daily) and have her follow-up with cardiology.  ED return cautions in place.  Patient agrees with plan. [MM]    Clinical Course User Index [MM] Clarine Ozell LABOR, MD   Medical Decision Making Amount and/or Complexity of Data Reviewed Labs: ordered. Radiology: ordered.  Risk Prescription drug management.          Clarine Ozell LABOR, MD 08/21/24 2153

## 2024-08-21 NOTE — ED Triage Notes (Signed)
 First nurse note: Pt to ED via POV from [facility]. Pt reports centralized CP and Bilateral arm tinlging and dizziness. HR at [facility] 184 momentarily and then back down to 80s

## 2024-08-21 NOTE — Discharge Instructions (Addendum)
 Please call the number provided for cardiology to arrange a follow-up appointment.  Return to the emergency department for any further chest pain, fast heart rate, or any other symptom per se concerning to yourself.  Given your frequent episodes of similar symptoms, I have started you on a low-dose medication to help control these given-they may change this at your cardiology appointment.  Please follow-up with your primary care provider in addition.  Return to the emergency department with any new or worsening symptoms.

## 2024-08-21 NOTE — ED Triage Notes (Signed)
 C?O mid chest pain x 1 day.  Onset yesterday evening.  Referred to ED from [facility].  Presents AAOx3. Skin warm and dry. No SOB/ DOE

## 2024-08-21 NOTE — ED Notes (Signed)
 CCMD called to initiate cardiac monitoring.

## 2024-08-21 NOTE — ED Provider Notes (Signed)
 [facility] Provider Note    Event Date/Time   First MD Initiated Contact with Patient 08/21/24 1424     (approximate)  History   Chief Complaint: Chest Pain  HPI  Maria Rhodes is a 53 y.o. female with a past medical history of hypertension, presents to the emergency department for chest tightness and tachycardia.  According to the patient last night she felt her heart racing and was having some chest tightness.  Patient states she gets this once or twice a month but usually goes away on its own.  Patient states it continued throughout the night and into this morning so the patient came to the emergency department for evaluation.  Patient describes the pain in the chest as being more of a tightness sensation.  No shortness of breath.  Patient does states she will get lightheaded at times when this occurs.  Physical Exam   Triage Vital Signs: ED Triage Vitals  Encounter Vitals Group     BP 08/21/24 1400 107/79     Girls Systolic BP Percentile --      Girls Diastolic BP Percentile --      Boys Systolic BP Percentile --      Boys Diastolic BP Percentile --      Pulse Rate 08/21/24 1400 (!) 181     Resp 08/21/24 1400 16     Temp 08/21/24 1400 98.1 F (36.7 C)     Temp Source 08/21/24 1400 Oral     SpO2 08/21/24 1400 99 %     Weight 08/21/24 1359 147 lb 4.3 oz (66.8 kg)     Height --      Head Circumference --      Peak Flow --      Pain Score 08/21/24 1359 7     Pain Loc --      Pain Education --      Exclude from Growth Chart --     Most recent vital signs: Vitals:   08/21/24 1400  BP: 107/79  Pulse: (!) 181  Resp: 16  Temp: 98.1 F (36.7 C)  SpO2: 99%    General: Awake, no distress.  CV:  Good peripheral perfusion.  Regular rhythm rate around 100 bpm currently. Resp:  Normal effort.  Equal breath sounds bilaterally.  Abd:  No distention.  Soft, nontender.  No rebound or guarding.  ED Results / Procedures / Treatments    EKG  EKG viewed and interpreted by myself appears to show supraventricular tachycardia at 183 bpm with a narrow QRS, normal axis, largely normal intervals.  Nonspecific ST changes.   MEDICATIONS ORDERED IN ED: Medications  lactated ringers  bolus 1,000 mL (has no administration in time range)     IMPRESSION / MDM / ASSESSMENT AND PLAN / ED COURSE  I reviewed the triage vital signs and the nursing notes.  Patient's presentation is most consistent with acute presentation with potential threat to life or bodily function.  Patient presents emergency department for tachycardia and chest tightness that occurred last night and continued through this morning.  Patient states this will occur once or twice monthly but usually goes away on its own.  Patient's EKG in the emergency department appears her supraventricular tachycardia.  They had the patient blow on a syringe while coming back to a room.  Will be hooked the patient up to the monitor she apparently had converted back to a normal sinus rhythm currently around 100 bpm and states she is  feeling better.  I suspect that the patient is intermittently going into SVT.  However as chest tightness has been ongoing since last night we will check labs including cardiac enzymes we will IV hydrate and continue to closely monitor.  If the patient's workup does not show any significant findings anticipate she could be discharged with outpatient follow-up but will need cardiology follow-up for possible Holter.  We will continue to close monitor the patient in the emergency department.  Patient care signed out to oncoming provider.  CBC is normal, chemistry pending.  FINAL CLINICAL IMPRESSION(S) / ED DIAGNOSES   Supraventricular tachycardia    Note:  This document was prepared using Dragon voice recognition software and may include unintentional dictation errors.   Dorothyann Drivers, MD 08/21/24 [id number]

## 2024-08-21 NOTE — ED Notes (Signed)
 LG top redraw sent to lab.

## 2024-09-12 ENCOUNTER — Ambulatory Visit: Attending: Internal Medicine | Admitting: Internal Medicine

## 2024-09-12 ENCOUNTER — Encounter: Payer: Self-pay | Admitting: Internal Medicine

## 2024-09-12 VITALS — BP 118/78 | HR 64 | Ht 64.0 in | Wt 207.8 lb

## 2024-09-12 DIAGNOSIS — R072 Precordial pain: Secondary | ICD-10-CM | POA: Diagnosis not present

## 2024-09-12 DIAGNOSIS — I471 Supraventricular tachycardia, unspecified: Secondary | ICD-10-CM

## 2024-09-12 DIAGNOSIS — R0602 Shortness of breath: Secondary | ICD-10-CM

## 2024-09-12 DIAGNOSIS — Z8673 Personal history of transient ischemic attack (TIA), and cerebral infarction without residual deficits: Secondary | ICD-10-CM | POA: Diagnosis not present

## 2024-09-12 DIAGNOSIS — R7989 Other specified abnormal findings of blood chemistry: Secondary | ICD-10-CM

## 2024-09-12 NOTE — Progress Notes (Signed)
 " Cardiology Office Note:  .   Date:  09/12/2024  ID:  [name], DOB [birth year], MRN [id number] PCP: Pcp, No  [facility] Providers Cardiologist:  None     History of Present Illness: .   Kebrina Rhodes is a 54 y.o. female with history of hypertension, TIA, and celiac disease, who has been referred by Dr. Clarine in the [facility] ED for evaluation of SVT.  The patient presented to the ED on 08/21/2024 with tachycardia and chest tightness.  She noted similar episodes occurring once or twice a month in the past, though these were typically self-limited.  Initial EKG showed SVT, which terminated with vagal maneuvers while in the triage area.  High-sensitivity troponin T was mildly elevated but flat (66 -> 63).  Today, [name] reports that she has been having episodes of rapid heart rates for years.  They are typically self-limited lasting for a few minutes at a time.  However, the episode that brought her to the ED last month lasted almost 2 days and was accompanied by lightheadedness, chest pain, and shortness of breath.  She saw a cardiologist for similar symptoms about 30 years ago but does not recall a specific diagnosis.  She reports the palpitations now happen 3-4 times a week.  She was started on metoprolol  to tartrate 12.5 mg twice daily in the ED but is not sure if it has helped much.  Ms. Maria Birkel' only other concern is of chronic pain and swelling in the right leg.  It began after she had a misstep in 10/2023.  She was evaluated by orthopedics and initially diagnosed with a hairline fracture.  However, this diagnosis has subsequently been questioned.  She underwent physical therapy with significant improvement in the pain and swelling, though she still has some discomfort from time to time.  She denies a history of recent travel or blood clots.  ROS: See HPI  Studies Reviewed: SABRA   EKG Interpretation Date/Time:  Wednesday September 12 2024 14:09:50  EST Ventricular Rate:  64 PR Interval:  116 QRS Duration:  84 QT Interval:  406 QTC Calculation: 418 R Axis:   -32  Text Interpretation: Normal sinus rhythm Left axis deviation Low voltage QRS Nonspecific T wave abnormality Cannot rule out Anterior infarct (cited on or before 21-Aug-2024) When compared with ECG of 21-Aug-2024 14:01, Normal sinus rhythm has replaced Supraventricular tachycardia Widespread ST abnormality has resolved Confirmed by [name] ([id number]) on 09/12/2024 2:14:17 PM    TTE (06/30/2018): Normal LV size and wall thickness.  LVEF 55-60% with grade 1 diastolic dysfunction.  Normal RV size and function.  Mild mitral regurgitation.  Risk Assessment/Calculations:             Physical Exam:   VS:  BP 118/78 (BP Location: Left Arm, Patient Position: Sitting, Cuff Size: Large)   Pulse 64   Ht 5' 4 (1.626 m)   Wt 207 lb 12.8 oz (94.3 kg)   SpO2 98%   BMI 35.67 kg/m    Wt Readings from Last 3 Encounters:  09/12/24 207 lb 12.8 oz (94.3 kg)  08/21/24 147 lb 4.3 oz (66.8 kg)  06/27/18 147 lb 4 oz (66.8 kg)    General:  NAD. Neck: No JVD or HJR. Lungs: Clear to auscultation bilaterally without wheezes or crackles. Heart: Regular rate and rhythm without murmurs, rubs, or gallops. Abdomen: Soft, nontender, nondistended. Extremities: Trace right ankle edema.  ASSESSMENT AND PLAN: .  Supraventricular tachycardia: Mr. Torain Sellars has a long history of palpitations that continue to happen 3-4 times a week.  The longest episode brought her to the ED last month, lasting more than a day and accompanied by chest pain, shortness of breath, and lightheadedness.  Initial EKG shows a regular, narrow complex tachycardia most consistent with SVT.  This terminated in the triage area with vagal maneuvers.  She was started on low-dose metoprolol  at the time but continues to have intermittent palpitations.  I am reluctant to escalate her metoprolol  further today, as her resting  heart rate is in the 60s.  I will obtain an echocardiogram and TSH.  I will also refer her to electrophysiology.  Chest pain, shortness of breath, and elevated troponin: Symptoms occurred in the setting of prolonged tachycardia, likely SVT seen when she presented to the ED.  She otherwise does not have chest pain or dyspnea with her usual activities.  I suspect this was most likely due to supply-demand mismatch.  However, I have recommended obtaining an echocardiogram and coronary CTA for further evaluation.  History of TIA: No new neurologic symptoms reported.  Continue clopidogrel  and statin therapy given history of aspirin intolerance.    Dispo: Follow-up to be determined based on results of aforementioned testing and EP consultation.  Signed, Lonni Hanson, MD  "

## 2024-09-12 NOTE — Patient Instructions (Signed)
 Medication Instructions:  Your physician recommends that you continue on your current medications as directed. Please refer to the Current Medication list given to you today.    *If you need a refill on your cardiac medications before your next appointment, please call your pharmacy*  Lab Work: Your provider would like for you to have following labs drawn today BMP, TSH, Mg.     Testing/Procedures: Your physician has requested that you have an echocardiogram. Echocardiography is a painless test that uses sound waves to create images of your heart. It provides your doctor with information about the size and shape of your heart and how well your hearts chambers and valves are working.   You may receive an ultrasound enhancing agent through an IV if needed to better visualize your heart during the echo. This procedure takes approximately one hour.  There are no restrictions for this procedure.  This will take place at [address] [facility] [address], [place] [address]  Please note: We ask at that you not bring children with you during ultrasound (echo/ vascular) testing. Due to room size and safety concerns, children are not allowed in the ultrasound rooms during exams. Our front office staff cannot provide observation of children in our lobby area while testing is being conducted. An adult accompanying a patient to their appointment will only be allowed in the ultrasound room at the discretion of the ultrasound technician under special circumstances. We apologize for any inconvenience.     Your cardiac CT will be scheduled at one of the below locations:   [facility] [address] [place], [place] [address] [phone]  If scheduled at [facility], please arrive 15 mins early for check-in and test prep.  There is spacious parking and easy access to the radiology department from the [facility] entrance. Please  enter here and check-in with the desk attendant.   Please follow these instructions carefully (unless otherwise directed):  An IV will be required for this test and Nitroglycerin will be given.    On the Night Before the Test: Be sure to Drink plenty of water. Do not consume any caffeinated/decaffeinated beverages or chocolate 12 hours prior to your test. Do not take any antihistamines 12 hours prior to your test.  On the Day of the Test: Drink plenty of water until 1 hour prior to the test. Do not eat any food 1 hour prior to test. You may take your regular medications prior to the test.  Take metoprolol  (Lopressor ) 25 mg (1 tablet) two hours prior to test. If you take Furosemide/Hydrochlorothiazide/Spironolactone/Chlorthalidone, please HOLD on the morning of the test. Patients who wear a continuous glucose monitor MUST remove the device prior to scanning. FEMALES- please wear underwire-free bra if available, avoid dresses & tight clothing       After the Test: Drink plenty of water. After receiving IV contrast, you may experience a mild flushed feeling. This is normal. On occasion, you may experience a mild rash up to 24 hours after the test. This is not dangerous. If this occurs, you can take Benadryl 25 mg, Zyrtec, Claritin, or Allegra and increase your fluid intake. (Patients taking Tikosyn should avoid Benadryl, and may take Zyrtec, Claritin, or Allegra) If you experience trouble breathing, this can be serious. If it is severe call [phone] IMMEDIATELY. If it is mild, please call our office.  We will call to schedule your test 2-4 weeks out understanding that some  insurance companies will need an authorization prior to the service being performed.   For more information and frequently asked questions, please visit our website : [email or web address]  For non-scheduling related questions, please contact the cardiac imaging nurse navigator should you have any  questions/concerns: Cardiac Imaging Nurse Navigators Direct Office Dial: [phone]   For scheduling needs, including cancellations and rescheduling, please call Brittany, [phone].    Follow-Up: At [facility], you and your health needs are our priority.  As part of our continuing mission to provide you with exceptional heart care, our providers are all part of one team.  This team includes your primary Cardiologist (physician) and Advanced Practice Providers or APPs (Physician Assistants and Nurse Practitioners) who all work together to provide you with the care you need, when you need it.  Your next appointment:   Based on test results   Provider:   You may see Lonni Hanson, MD or one of the following Advanced Practice Providers on your designated Care Team:   Lonni Meager, NP Lesley Maffucci, PA-C Bernardino Bring, PA-C Cadence Troy, PA-C Tylene Lunch, NP Barnie Hila, NP

## 2024-09-13 ENCOUNTER — Encounter: Payer: Self-pay | Admitting: Internal Medicine

## 2024-09-13 DIAGNOSIS — I471 Supraventricular tachycardia, unspecified: Secondary | ICD-10-CM | POA: Insufficient documentation

## 2024-09-13 DIAGNOSIS — R7989 Other specified abnormal findings of blood chemistry: Secondary | ICD-10-CM | POA: Insufficient documentation

## 2024-09-13 DIAGNOSIS — R0602 Shortness of breath: Secondary | ICD-10-CM | POA: Insufficient documentation

## 2024-09-13 DIAGNOSIS — R072 Precordial pain: Secondary | ICD-10-CM | POA: Insufficient documentation

## 2024-09-13 DIAGNOSIS — Z8673 Personal history of transient ischemic attack (TIA), and cerebral infarction without residual deficits: Secondary | ICD-10-CM | POA: Insufficient documentation

## 2024-09-13 LAB — BASIC METABOLIC PANEL WITH GFR
BUN/Creatinine Ratio: 13 (ref 9–23)
BUN: 11 mg/dL (ref 6–24)
CO2: 22 mmol/L (ref 20–29)
Calcium: 9.7 mg/dL (ref 8.7–10.2)
Chloride: 107 mmol/L — ABNORMAL HIGH (ref 96–106)
Creatinine, Ser: 0.87 mg/dL (ref 0.57–1.00)
Glucose: 95 mg/dL (ref 70–99)
Potassium: 4.3 mmol/L (ref 3.5–5.2)
Sodium: 146 mmol/L — ABNORMAL HIGH (ref 134–144)
eGFR: 79 mL/min/1.73

## 2024-09-13 LAB — TSH: TSH: 0.559 u[IU]/mL (ref 0.450–4.500)

## 2024-09-13 LAB — MAGNESIUM: Magnesium: 1.9 mg/dL (ref 1.6–2.3)

## 2024-09-14 ENCOUNTER — Ambulatory Visit: Payer: Self-pay | Admitting: Internal Medicine

## 2024-09-18 NOTE — Telephone Encounter (Signed)
 Pt requesting a c/b to discuss results.

## 2024-09-19 ENCOUNTER — Telehealth (HOSPITAL_COMMUNITY): Payer: Self-pay | Admitting: *Deleted

## 2024-09-19 ENCOUNTER — Telehealth (HOSPITAL_COMMUNITY): Payer: Self-pay | Admitting: Emergency Medicine

## 2024-09-19 NOTE — Telephone Encounter (Signed)
 Reaching out to patient to offer assistance regarding upcoming cardiac imaging study; pt verbalizes understanding of appt date/time, parking situation and where to check in, pre-test NPO status and medications ordered, and verified current allergies; name and call back number provided for further questions should they arise Rockwell Alexandria RN Navigator Cardiac Imaging [facility] [phone] office [phone] cell

## 2024-09-19 NOTE — Telephone Encounter (Signed)
 Attempted to call patient regarding upcoming cardiac CT appointment. Left message on voicemail with name and callback number Sid Seats RN Navigator Cardiac Imaging [facility] [phone] Office

## 2024-09-20 ENCOUNTER — Ambulatory Visit
Admission: RE | Admit: 2024-09-20 | Discharge: 2024-09-20 | Disposition: A | Discharge status: Home / Self Care | Source: Ambulatory Visit | Attending: Internal Medicine | Admitting: Internal Medicine

## 2024-09-20 DIAGNOSIS — R7989 Other specified abnormal findings of blood chemistry: Secondary | ICD-10-CM | POA: Diagnosis present

## 2024-09-20 DIAGNOSIS — R072 Precordial pain: Secondary | ICD-10-CM | POA: Insufficient documentation

## 2024-09-20 MED ORDER — DILTIAZEM HCL 25 MG/5ML IV SOLN: 10.0000 mg | INTRAVENOUS | Status: DC | PRN

## 2024-09-20 MED ORDER — METOPROLOL TARTRATE 5 MG/5ML IV SOLN: 10.0000 mg | Freq: Once | INTRAVENOUS | Status: DC | PRN

## 2024-09-20 MED ORDER — IOHEXOL 350 MG/ML SOLN
100.0000 mL | Freq: Once | INTRAVENOUS | Status: AC | PRN
  Administered 2024-09-20: 100 mL via INTRAVENOUS

## 2024-09-20 MED ORDER — NITROGLYCERIN 0.4 MG SL SUBL
0.8000 mg | SUBLINGUAL_TABLET | Freq: Once | SUBLINGUAL | Status: AC
  Administered 2024-09-20: 0.8 mg via SUBLINGUAL
  Filled 2024-09-20: qty 25

## 2024-09-20 NOTE — Progress Notes (Signed)
 Patient tolerated procedure well. W/C to lobby.  Ambulate w/o difficulty. Denies light headedness or being dizzy. Encouraged to drink extra water today and reasoning explained. Verbalized understanding. All questions answered. ABC intact. No further needs. Discharge from procedure area w/o issues.

## 2024-09-28 ENCOUNTER — Ambulatory Visit: Attending: Internal Medicine

## 2024-09-28 DIAGNOSIS — I471 Supraventricular tachycardia, unspecified: Secondary | ICD-10-CM | POA: Diagnosis not present

## 2024-09-28 LAB — ECHOCARDIOGRAM COMPLETE
AR max vel: 2.29 cm2
AV Area VTI: 2.18 cm2
AV Area mean vel: 2.15 cm2
AV Mean grad: 3 mmHg
AV Peak grad: 6 mmHg
Ao pk vel: 1.22 m/s
Area-P 1/2: 2.3 cm2
MV M vel: 4.03 m/s
MV Peak grad: 65 mmHg
S' Lateral: 3.07 cm

## 2024-10-10 ENCOUNTER — Ambulatory Visit: Admitting: Cardiology

## 2025-01-01 ENCOUNTER — Ambulatory Visit: Admitting: Cardiology
